# Patient Record
Sex: Male | Born: 1950 | Race: White | Hispanic: No | Marital: Single | Smoking: Never smoker
Health system: Southern US, Community
[De-identification: ages and names within clinical notes are randomized; demographics above are authoritative.]

## PROBLEM LIST (undated history)

## (undated) DIAGNOSIS — K589 Irritable bowel syndrome without diarrhea: Secondary | ICD-10-CM

## (undated) DIAGNOSIS — M51379 Other intervertebral disc degeneration, lumbosacral region without mention of lumbar back pain or lower extremity pain: Secondary | ICD-10-CM

## (undated) DIAGNOSIS — M76899 Other specified enthesopathies of unspecified lower limb, excluding foot: Secondary | ICD-10-CM

## (undated) DIAGNOSIS — M5137 Other intervertebral disc degeneration, lumbosacral region: Secondary | ICD-10-CM

## (undated) DIAGNOSIS — K5792 Diverticulitis of intestine, part unspecified, without perforation or abscess without bleeding: Secondary | ICD-10-CM

## (undated) DIAGNOSIS — Z9109 Other allergy status, other than to drugs and biological substances: Secondary | ICD-10-CM

## (undated) DIAGNOSIS — Z8619 Personal history of other infectious and parasitic diseases: Secondary | ICD-10-CM

## (undated) DIAGNOSIS — E7800 Pure hypercholesterolemia, unspecified: Secondary | ICD-10-CM

## (undated) DIAGNOSIS — K649 Unspecified hemorrhoids: Secondary | ICD-10-CM

## (undated) HISTORY — PX: HEMORRHOID SURGERY: SHX153

## (undated) HISTORY — PX: COLONOSCOPY: SHX174

## (undated) HISTORY — PX: ESOPHAGOGASTRODUODENOSCOPY: SHX1529

---

## 2005-06-09 ENCOUNTER — Emergency Department: Payer: Self-pay | Admitting: Emergency Medicine

## 2005-06-09 ENCOUNTER — Other Ambulatory Visit: Payer: Self-pay

## 2005-06-16 ENCOUNTER — Ambulatory Visit: Payer: Self-pay | Admitting: Internal Medicine

## 2005-07-13 ENCOUNTER — Ambulatory Visit: Payer: Self-pay | Admitting: Gastroenterology

## 2009-07-03 ENCOUNTER — Ambulatory Visit: Payer: Self-pay | Admitting: Surgery

## 2010-11-10 ENCOUNTER — Ambulatory Visit: Payer: Self-pay | Admitting: Unknown Physician Specialty

## 2011-09-17 ENCOUNTER — Ambulatory Visit: Payer: Self-pay | Admitting: Gastroenterology

## 2011-10-26 ENCOUNTER — Ambulatory Visit: Payer: Self-pay | Admitting: Unknown Physician Specialty

## 2012-02-08 DIAGNOSIS — R194 Change in bowel habit: Secondary | ICD-10-CM | POA: Insufficient documentation

## 2012-02-08 DIAGNOSIS — K589 Irritable bowel syndrome without diarrhea: Secondary | ICD-10-CM | POA: Insufficient documentation

## 2012-08-29 ENCOUNTER — Ambulatory Visit: Payer: Self-pay | Admitting: Unknown Physician Specialty

## 2013-11-13 DIAGNOSIS — M76899 Other specified enthesopathies of unspecified lower limb, excluding foot: Secondary | ICD-10-CM | POA: Insufficient documentation

## 2014-03-30 IMAGING — RF DG UGI W/ SMALL BOWEL
1 series · 15 of 15 positions shown · non-contrast
Comparison: none

REASON FOR EXAM: irritable colon
COMMENTS:

[Series 1: run · 15 of 15 slices shown]
[im 1/15]
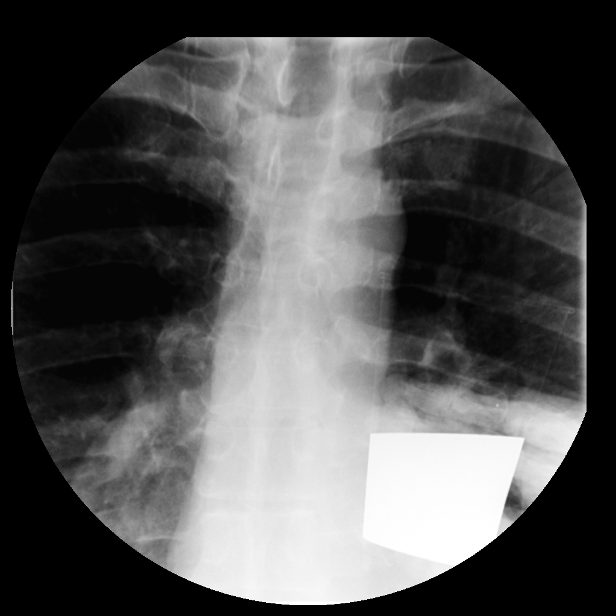
[im 2/15]
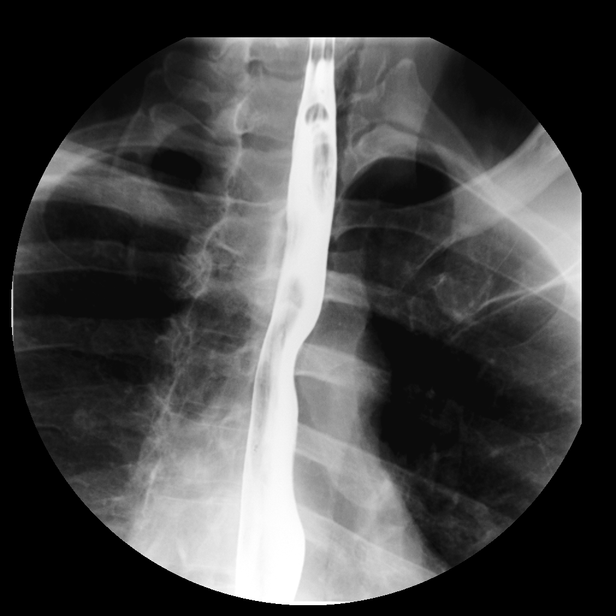
[im 3/15]
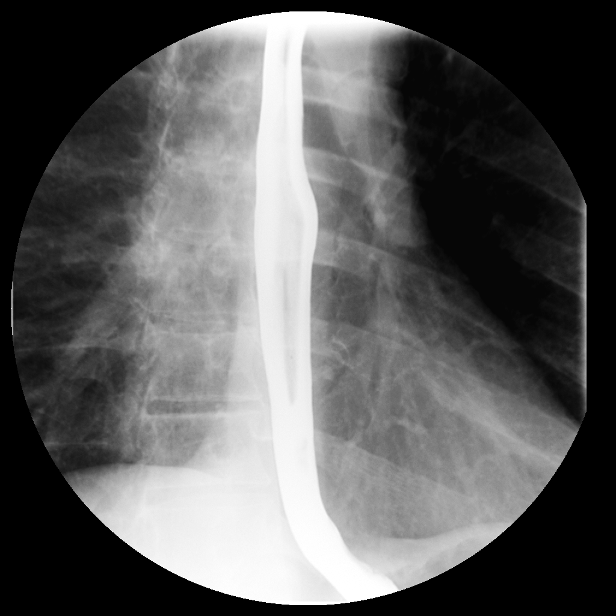
[im 4/15]
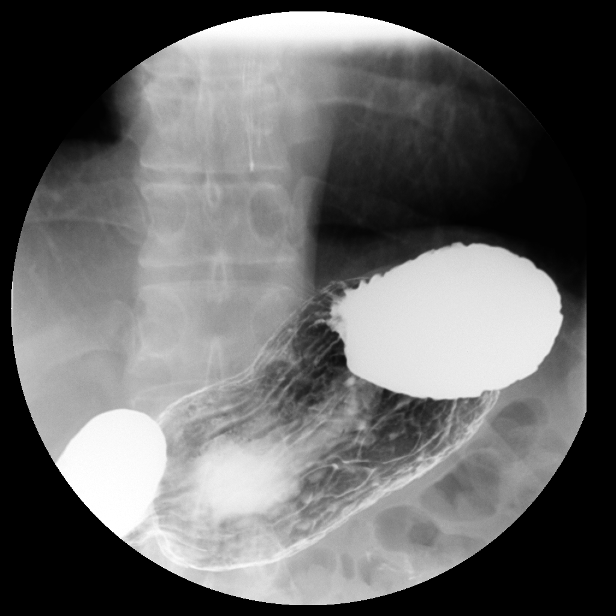
[im 5/15]
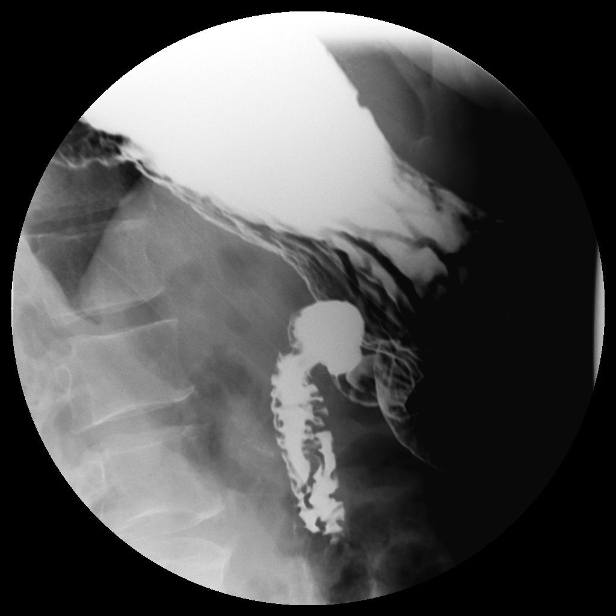
[im 6/15]
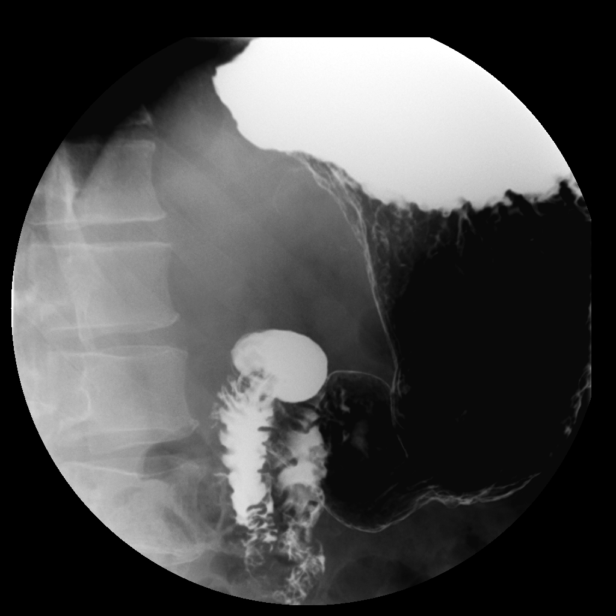
[im 7/15]
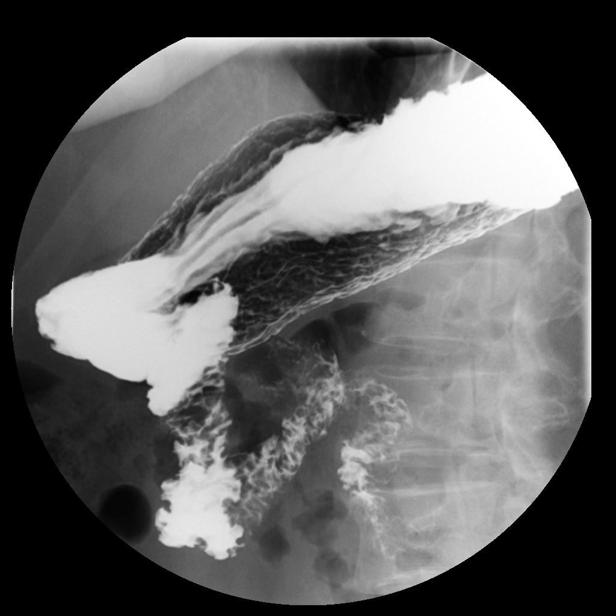
[im 8/15]
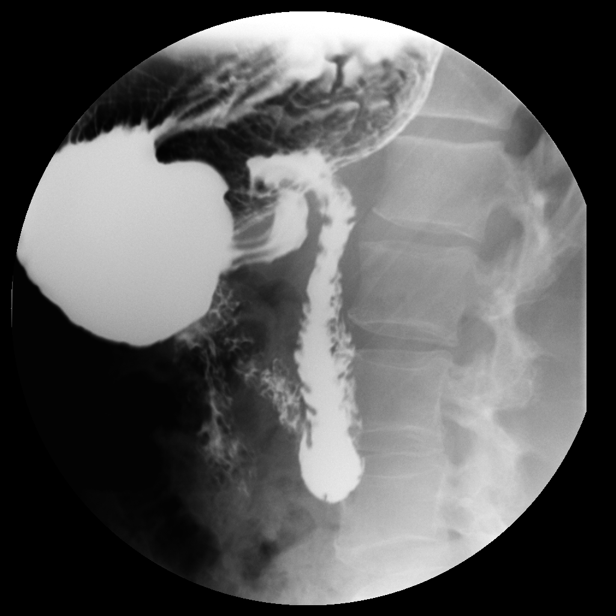
[im 9/15]
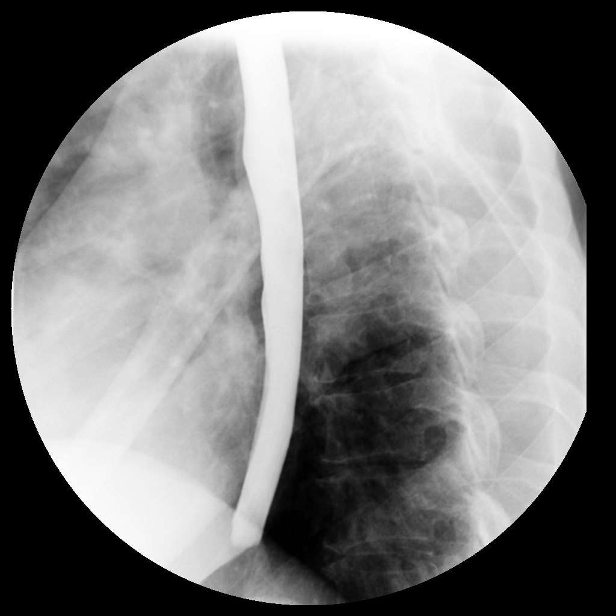
[im 10/15]
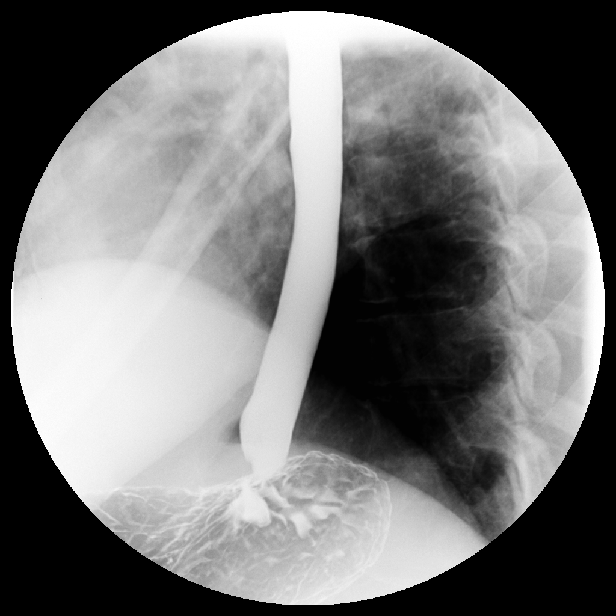
[im 11/15]
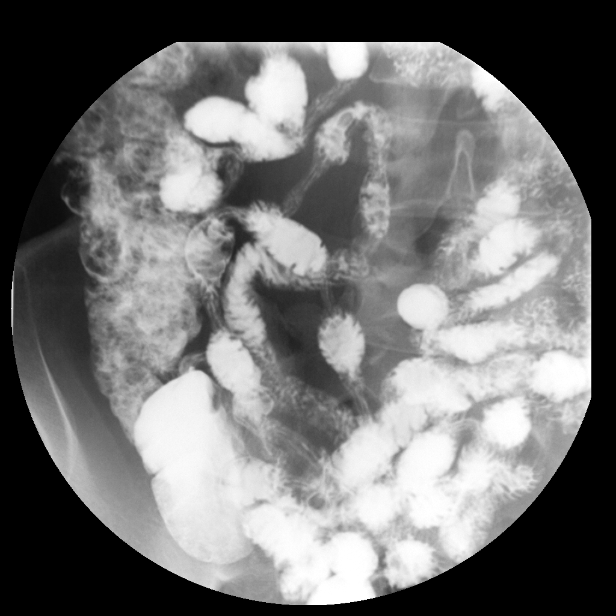
[im 12/15]
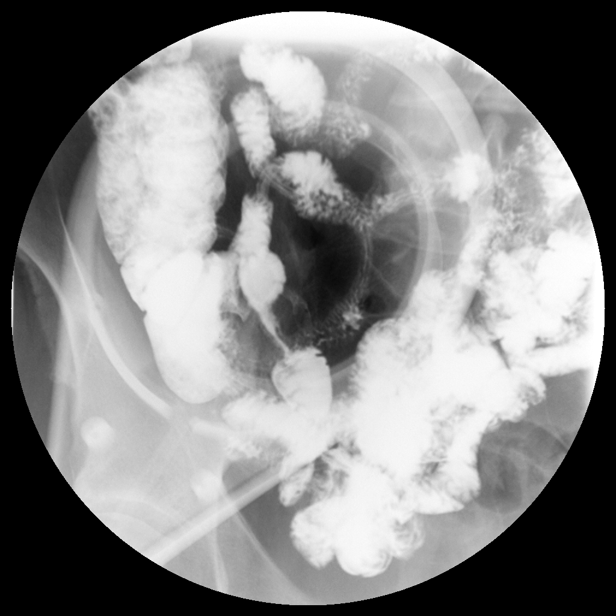
[im 13/15]
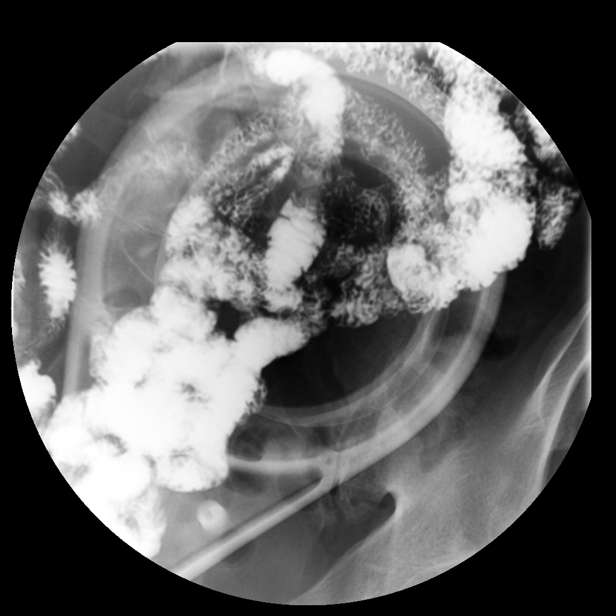
[im 14/15]
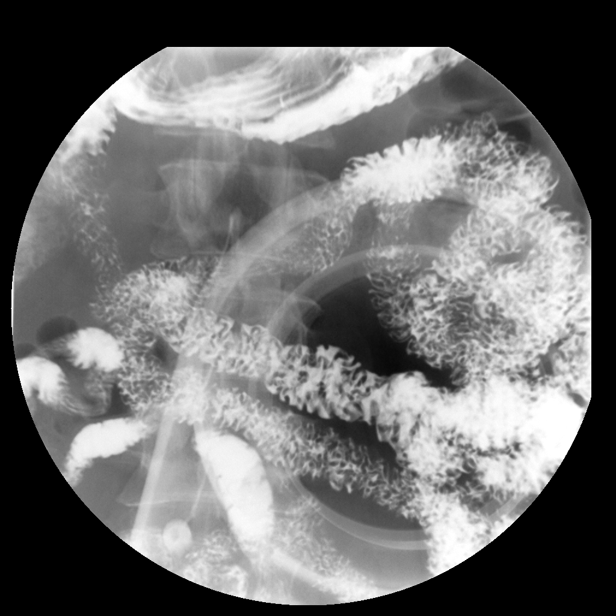
[im 15/15]
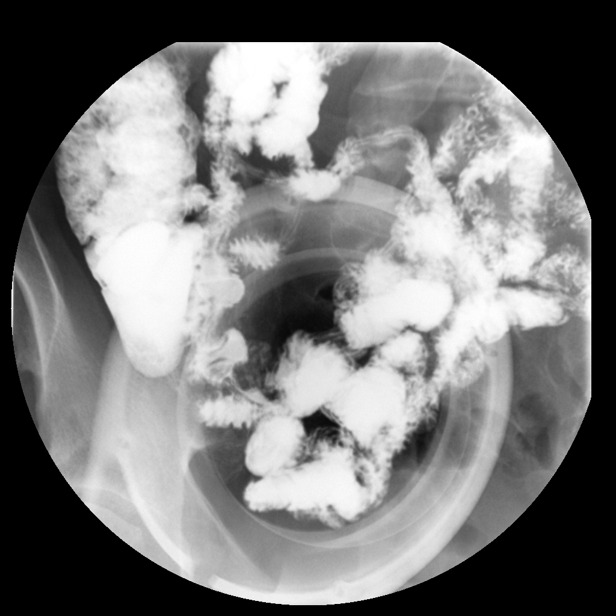

[15 of 15 positions shown; findings below may reference images not displayed]

PROCEDURE:     FL  - FL UPPER GI WITH SMALL BOWEL  - September 17, 2011  [DATE]

RESULT:     Standard air contrast upper GI and standard small bowel
follow-through was performed. The esophagus and stomach are normal. Duodenal
bulb is normal. Small bowel is normal. Terminal ileum is normal. Bowel
transit time is normal.
IMPRESSION: Normal exam. No evidence of reflux or peptic ulcer disease.
Small bowel is normal.

## 2014-06-12 ENCOUNTER — Ambulatory Visit: Payer: Self-pay | Admitting: Unknown Physician Specialty

## 2014-06-26 DIAGNOSIS — M51369 Other intervertebral disc degeneration, lumbar region without mention of lumbar back pain or lower extremity pain: Secondary | ICD-10-CM | POA: Insufficient documentation

## 2014-06-26 DIAGNOSIS — M5416 Radiculopathy, lumbar region: Secondary | ICD-10-CM | POA: Insufficient documentation

## 2015-02-06 ENCOUNTER — Ambulatory Visit
Admit: 2015-02-06 | Discharge: 2015-02-06 | Disposition: A | Discharge status: Home / Self Care | Payer: BC Managed Care – PPO | Attending: Family Medicine | Admitting: Family Medicine

## 2015-02-06 ENCOUNTER — Ambulatory Visit
Admission: EM | Admit: 2015-02-06 | Discharge: 2015-02-06 | Disposition: A | Discharge status: Home / Self Care | Payer: BC Managed Care – PPO | Attending: Family Medicine | Admitting: Family Medicine

## 2015-02-06 ENCOUNTER — Encounter: Payer: Self-pay | Admitting: Emergency Medicine

## 2015-02-06 DIAGNOSIS — IMO0001 Reserved for inherently not codable concepts without codable children: Secondary | ICD-10-CM

## 2015-02-06 DIAGNOSIS — R911 Solitary pulmonary nodule: Secondary | ICD-10-CM | POA: Diagnosis not present

## 2015-02-06 DIAGNOSIS — R1032 Left lower quadrant pain: Secondary | ICD-10-CM

## 2015-02-06 HISTORY — DX: Diverticulitis of intestine, part unspecified, without perforation or abscess without bleeding: K57.92

## 2015-02-06 LAB — COMPREHENSIVE METABOLIC PANEL
ALK PHOS: 35 U/L — AB (ref 38–126)
ALT: 16 U/L — ABNORMAL LOW (ref 17–63)
ANION GAP: 8 (ref 5–15)
AST: 20 U/L (ref 15–41)
Albumin: 4.8 g/dL (ref 3.5–5.0)
BUN: 7 mg/dL (ref 6–20)
CALCIUM: 9.3 mg/dL (ref 8.9–10.3)
CHLORIDE: 94 mmol/L — AB (ref 101–111)
CO2: 30 mmol/L (ref 22–32)
CREATININE: 0.86 mg/dL (ref 0.61–1.24)
Glucose, Bld: 94 mg/dL (ref 65–99)
Potassium: 4.4 mmol/L (ref 3.5–5.1)
SODIUM: 132 mmol/L — AB (ref 135–145)
Total Bilirubin: 2.2 mg/dL — ABNORMAL HIGH (ref 0.3–1.2)
Total Protein: 7.9 g/dL (ref 6.5–8.1)

## 2015-02-06 LAB — CBC WITH DIFFERENTIAL/PLATELET
BASOS ABS: 0 10*3/uL (ref 0–0.1)
Basophils Relative: 1 %
Eosinophils Absolute: 0 10*3/uL (ref 0–0.7)
Eosinophils Relative: 1 %
HEMATOCRIT: 45.9 % (ref 40.0–52.0)
HEMOGLOBIN: 15.6 g/dL (ref 13.0–18.0)
Lymphocytes Relative: 22 %
Lymphs Abs: 0.9 10*3/uL — ABNORMAL LOW (ref 1.0–3.6)
MCH: 33.5 pg (ref 26.0–34.0)
MCHC: 34 g/dL (ref 32.0–36.0)
MCV: 98.6 fL (ref 80.0–100.0)
Monocytes Absolute: 0.7 10*3/uL (ref 0.2–1.0)
Monocytes Relative: 15 %
NEUTROS ABS: 2.7 10*3/uL (ref 1.4–6.5)
NEUTROS PCT: 61 %
Platelets: 182 10*3/uL (ref 150–440)
RBC: 4.65 MIL/uL (ref 4.40–5.90)
RDW: 12.6 % (ref 11.5–14.5)
WBC: 4.4 10*3/uL (ref 3.8–10.6)

## 2015-02-06 LAB — URINALYSIS COMPLETE WITH MICROSCOPIC (ARMC ONLY)
BACTERIA UA: NONE SEEN — AB
BILIRUBIN URINE: NEGATIVE
GLUCOSE, UA: NEGATIVE mg/dL
HGB URINE DIPSTICK: NEGATIVE
Ketones, ur: NEGATIVE mg/dL
LEUKOCYTES UA: NEGATIVE
NITRITE: NEGATIVE
PH: 7 (ref 5.0–8.0)
Protein, ur: NEGATIVE mg/dL
RBC / HPF: NONE SEEN RBC/hpf (ref <3)
SPECIFIC GRAVITY, URINE: 1.01 (ref 1.005–1.030)
Squamous Epithelial / LPF: NONE SEEN — AB

## 2015-02-06 LAB — LIPASE, BLOOD: LIPASE: 21 U/L — AB (ref 22–51)

## 2015-02-06 LAB — AMYLASE: Amylase: 56 U/L (ref 28–100)

## 2015-02-06 MED ORDER — IOHEXOL 300 MG/ML  SOLN
150.0000 mL | Freq: Once | INTRAMUSCULAR | Status: AC | PRN
  Administered 2015-02-06: 120 mL via INTRAVENOUS

## 2015-02-06 MED ORDER — SUCRALFATE 1 G PO TABS: 2.0000 g | ORAL_TABLET | Freq: Two times a day (BID) | ORAL | Status: DC

## 2015-02-06 NOTE — ED Notes (Signed)
This nurse contacted [employer] and authorization obtained for CT Abd/Pelvis with contrast. Authorization # [id number] good for 02/06/15-03/07/15. Spoke with [employer]

## 2015-02-06 NOTE — Discharge Instructions (Signed)
Abdominal Pain Many things can cause belly (abdominal) pain. Most times, the belly pain is not dangerous. Many cases of belly pain can be watched and treated at home. HOME CARE   Do not take medicines that help you go poop (laxatives) unless told to by your doctor.  Only take medicine as told by your doctor.  Eat or drink as told by your doctor. Your doctor will tell you if you should be on a special diet. GET HELP IF:  You do not know what is causing your belly pain.  You have belly pain while you are sick to your stomach (nauseous) or have runny poop (diarrhea).  You have pain while you pee or poop.  Your belly pain wakes you up at night.  You have belly pain that gets worse or better when you eat.  You have belly pain that gets worse when you eat fatty foods.  You have a fever. GET HELP RIGHT AWAY IF:   The pain does not go away within 2 hours.  You keep throwing up (vomiting).  The pain changes and is only in the right or left part of the belly.  You have bloody or tarry looking poop. MAKE SURE YOU:   Understand these instructions.  Will watch your condition.  Will get help right away if you are not doing well or get worse. Document Released: 10/20/2007 Document Revised: 05/08/2013 Document Reviewed: 01/10/2013 [facility] Patient Information 2015 [place], [place]. This information is not intended to replace advice given to you by your health care provider. Make sure you discuss any questions you have with your health care provider.  Pulmonary Nodule  A pulmonary nodule is a small, round spot in your lung. It is usually found when pictures of your lungs are taken for other reasons. Most pulmonary nodules are not cancerous and do not cause symptoms. Tests will be done to make sure the nodule is not cancerous. Pulmonary nodules that are not cancerous usually do not require treatment. HOME CARE   Only take medicine as told by your doctor.  Follow up with your doctor as  told. GET HELP IF:  You have trouble breathing when doing activities.  You feel sick or more tired than normal.  You do not feel like eating.  You lose weight without trying to.  You have chills.  You have night sweats. GET HELP RIGHT AWAY IF:  You cannot catch your breath.  You start making whistling sounds when breathing (wheezing).  You have a cough that does not go away.  You cough up blood.  You are dizzy or feel like you are going to pass out.  You have sudden chest pain.  You have a fever or lasting symptoms for more than 2-3 days.  You have a fever and your symptoms suddenly get worse. MAKE SURE YOU:  Understand these instructions.  Will watch your condition.  Will get help right away if you are not doing well or get worse. Document Released: 06/05/2010 Document Revised: 01/03/2013 Document Reviewed: 10/23/2012 [facility] Patient Information 2015 [place], [place]. This information is not intended to replace advice given to you by your health care provider. Make sure you discuss any questions you have with your health care provider.

## 2015-02-06 NOTE — ED Notes (Signed)
Patient c/o left sided abdominal pain and bloating for the past 2 days.  Patient denies N/V/D.  Patient denies fevers.

## 2015-02-06 NOTE — ED Provider Notes (Signed)
CSN: [id number]     Arrival date & time 02/06/15  0721 History   First MD Initiated Contact with Patient 02/06/15 0802     Chief Complaint  Patient presents with  . Abdominal Pain   He reports abdominal pain for about 3 days. Eating food makes the pain worse He denies seeing blood in stool. Does report some changes in stool consistency and more constipation at this time. He's had this before years ago and he was put on anabiotic's for possible diverticulitis. At that time he had no x-ray studies or diagnostic studies to prove that he had diverticulitis. His last colonoscopy was 5 years ago he had 2 polyps removed and he was also told he had diverticulosis at that time. (Consider location/radiation/quality/duration/timing/severity/associated sxs/prior Treatment) Patient is a 64 y.o. male presenting with abdominal pain. The history is provided by the patient. No language interpreter was used.  Abdominal Pain Pain location:  LLQ Pain quality: aching, bloating, cramping, fullness and stabbing   Pain radiates to:  Does not radiate Pain severity:  Moderate Duration:  3 days Progression:  Waxing and waning Chronicity:  New Context: not alcohol use, not medication withdrawal, not previous surgeries, not recent travel, not retching, not sick contacts, not suspicious food intake and not trauma   Relieved by:  Nothing Worsened by:  Nothing tried Associated symptoms: constipation and nausea   Associated symptoms: no anorexia, no chest pain, no shortness of breath and no vomiting   Risk factors: aspirin and being elderly   Risk factors: no alcohol abuse, has not had multiple surgeries, no NSAID use and not obese     Past Medical History  Diagnosis Date  . Diverticulitis    History reviewed. No pertinent past surgical history. History reviewed. No pertinent family history. Social History  Substance Use Topics  . Smoking status: Never Smoker   . Smokeless tobacco: Never Used  . Alcohol Use: No     nurse's notes were reviewed. Patient does not smoke or drink alcohol. He has trouble with allergies to use Zyrtec on a daily basis especially when he mows his lawn. Review of Systems  HENT: Negative.   Respiratory: Negative for chest tightness and shortness of breath.   Cardiovascular: Negative for chest pain.  Gastrointestinal: Positive for nausea, abdominal pain, constipation and abdominal distention. Negative for vomiting and anorexia.  All other systems reviewed and are negative.   Allergies  Review of patient's allergies indicates no known allergies.  Home Medications   Prior to Admission medications   Medication Sig Start Date End Date Taking? Authorizing Provider  aspirin 81 MG tablet Take 81 mg by mouth daily.   Yes Historical Provider, MD  cetirizine (ZYRTEC) 10 MG tablet Take 10 mg by mouth daily.   Yes Historical Provider, MD  sucralfate (CARAFATE) 1 G tablet Take 2 tablets (2 g total) by mouth 2 (two) times daily. An hour before eating. 02/06/15   Hassan Rowan, MD   Meds Ordered and Administered this Visit  Medications - No data to display  BP 114/65 mmHg  Pulse 62  Temp(Src) 96.9 F (36.1 C) (Tympanic)  Resp 16  Ht  (1.88 m)  Wt 185 lb (83.915 kg)  BMI 23.74 kg/m2  SpO2 98% No data found.   Physical Exam  Constitutional: He is oriented to person, place, and time. He appears well-developed and well-nourished.  HENT:  Head: Normocephalic and atraumatic.  Eyes: Conjunctivae are normal. Pupils are equal, round, and reactive to light.  Neck: Normal range of motion. Neck supple. No thyromegaly present.  Cardiovascular: Normal rate, regular rhythm and normal heart sounds.   No murmur heard. Pulmonary/Chest: Effort normal and breath sounds normal.  Abdominal: Soft. He exhibits distension. He exhibits no mass. There is no hepatosplenomegaly. There is tenderness in the left lower quadrant. There is no rebound, no guarding and no CVA tenderness. A hernia is  present. Hernia confirmed positive in the ventral area.  Musculoskeletal: Normal range of motion.  Neurological: He is alert and oriented to person, place, and time. He has normal reflexes.  Skin: Skin is warm.  Psychiatric: He has a normal mood and affect.    ED Course  Procedures (including critical care time)  Labs Review Labs Reviewed  CBC WITH DIFFERENTIAL/PLATELET - Abnormal; Notable for the following:    Lymphs Abs 0.9 (*)    All other components within normal limits  LIPASE, BLOOD - Abnormal; Notable for the following:    Lipase 21 (*)    All other components within normal limits  COMPREHENSIVE METABOLIC PANEL - Abnormal; Notable for the following:    Sodium 132 (*)    Chloride 94 (*)    ALT 16 (*)    Alkaline Phosphatase 35 (*)    Total Bilirubin 2.2 (*)    All other components within normal limits  URINALYSIS COMPLETEWITH MICROSCOPIC ([facility] ONLY) - Abnormal; Notable for the following:    Color, Urine STRAW (*)    Bacteria, UA NONE SEEN (*)    Squamous Epithelial / LPF NONE SEEN (*)    All other components within normal limits  AMYLASE    Imaging Review Ct Abdomen Pelvis W Contrast  02/06/2015   CLINICAL DATA:  Acute left lower quadrant abdominal pain.  EXAM: CT ABDOMEN AND PELVIS WITH CONTRAST  TECHNIQUE: Multidetector CT imaging of the abdomen and pelvis was performed using the standard protocol following bolus administration of intravenous contrast.  CONTRAST:  OMNIPAQUE IOHEXOL 300 MG/ML  SOLN  COMPARISON:  None available currently.  FINDINGS: 4.4 mm subpleural nodule is noted anteriorly in the inferior portion of the lingular segment of the left lower lobe. No significant osseous abnormality is noted.  No gallstones are noted. The liver, spleen and pancreas appear normal. Adrenal glands appear normal. No hydronephrosis or renal obstruction is noted. No renal or ureteral calculi are noted. Parapelvic cysts are noted in the left kidney. There is no evidence of  bowel obstruction. The appendix appears normal. Urinary bladder appears normal. Mild prostatic enlargement is noted. There is no evidence of abdominal aortic aneurysm or dissection. No abnormal fluid collection is noted. No significant adenopathy is noted.  IMPRESSION: Mild prostatic enlargement.  No acute abnormality seen in the abdomen or pelvis.  4.4 mm subpleural nodule seen in left lung base. If the patient is at high risk for bronchogenic carcinoma, follow-up chest CT at 1 year is recommended. If the patient is at low risk, no follow-up is needed. This recommendation follows the consensus statement: Guidelines for Management of Small Pulmonary Nodules Detected on CT Scans: A Statement from the Fleischner Society as published in Radiology 2005; 237:395-400.   Electronically Signed   By: Lupita Raider, M.D.   On: 02/06/2015 10:32     Visual Acuity Review  Right Eye Distance:   Left Eye Distance:   Bilateral Distance:    Right Eye Near:   Left Eye Near:    Bilateral Near:    Results for orders placed or performed during  the hospital encounter of 02/06/15  CBC with Differential  Result Value Ref Range   WBC 4.4 3.8 - 10.6 K/uL   RBC 4.65 4.40 - 5.90 MIL/uL   Hemoglobin 15.6 13.0 - 18.0 g/dL   HCT 16.1 09.6 - 04.5 %   MCV 98.6 80.0 - 100.0 fL   MCH 33.5 26.0 - 34.0 pg   MCHC 34.0 32.0 - 36.0 g/dL   RDW 40.9 81.1 - 91.4 %   Platelets 182 150 - 440 K/uL   Neutrophils Relative % 61 %   Neutro Abs 2.7 1.4 - 6.5 K/uL   Lymphocytes Relative 22 %   Lymphs Abs 0.9 (L) 1.0 - 3.6 K/uL   Monocytes Relative 15 %   Monocytes Absolute 0.7 0.2 - 1.0 K/uL   Eosinophils Relative 1 %   Eosinophils Absolute 0.0 0 - 0.7 K/uL   Basophils Relative 1 %   Basophils Absolute 0.0 0 - 0.1 K/uL  Amylase  Result Value Ref Range   Amylase 56 28 - 100 U/L  Lipase, blood  Result Value Ref Range   Lipase 21 (L) 22 - 51 U/L  Comprehensive metabolic panel  Result Value Ref Range   Sodium 132 (L) 135 - 145  mmol/L   Potassium 4.4 3.5 - 5.1 mmol/L   Chloride 94 (L) 101 - 111 mmol/L   CO2 30 22 - 32 mmol/L   Glucose, Bld 94 65 - 99 mg/dL   BUN 7 6 - 20 mg/dL   Creatinine, Ser 7.82 0.61 - 1.24 mg/dL   Calcium 9.3 8.9 - 95.6 mg/dL   Total Protein 7.9 6.5 - 8.1 g/dL   Albumin 4.8 3.5 - 5.0 g/dL   AST 20 15 - 41 U/L   ALT 16 (L) 17 - 63 U/L   Alkaline Phosphatase 35 (L) 38 - 126 U/L   Total Bilirubin 2.2 (H) 0.3 - 1.2 mg/dL   GFR calc non Af Amer >60 >60 mL/min   GFR calc Af Amer >60 >60 mL/min   Anion gap 8 5 - 15  Urinalysis complete, with microscopic  Result Value Ref Range   Color, Urine STRAW (A) YELLOW   APPearance CLEAR CLEAR   Glucose, UA NEGATIVE NEGATIVE mg/dL   Bilirubin Urine NEGATIVE NEGATIVE   Ketones, ur NEGATIVE NEGATIVE mg/dL   Specific Gravity, Urine 1.010 1.005 - 1.030   Hgb urine dipstick NEGATIVE NEGATIVE   pH 7.0 5.0 - 8.0   Protein, ur NEGATIVE NEGATIVE mg/dL   Nitrite NEGATIVE NEGATIVE   Leukocytes, UA NEGATIVE NEGATIVE   RBC / HPF NONE SEEN <3 RBC/hpf   WBC, UA 0-5 <3 WBC/hpf   Bacteria, UA NONE SEEN (A) RARE   Squamous Epithelial / LPF NONE SEEN (A) RARE       MDM   1. Abdominal pain, LLQ (left lower quadrant)   2. Pulmonary nodule less than 6 cm determined by computed tomography of lung    Patient will be placed on Carafate for stomach discomfort. At this time no signs not colitis seen on CT scan. We'll have him follow-up pulmonary nodule with Dr. Arlana Pouch in the near future. Work note until Saturday given as well.    Hassan Rowan, MD 02/06/15 1100

## 2015-08-11 ENCOUNTER — Other Ambulatory Visit: Payer: Self-pay | Admitting: Surgery

## 2015-08-11 DIAGNOSIS — K824 Cholesterolosis of gallbladder: Secondary | ICD-10-CM

## 2015-08-19 ENCOUNTER — Ambulatory Visit
Admission: RE | Admit: 2015-08-19 | Discharge: 2015-08-19 | Disposition: A | Discharge status: Home / Self Care | Payer: BC Managed Care – PPO | Source: Ambulatory Visit | Attending: Surgery | Admitting: Surgery

## 2015-08-19 DIAGNOSIS — K824 Cholesterolosis of gallbladder: Secondary | ICD-10-CM | POA: Insufficient documentation

## 2016-05-21 ENCOUNTER — Ambulatory Visit
Admission: EM | Admit: 2016-05-21 | Discharge: 2016-05-21 | Disposition: A | Discharge status: Home / Self Care | Payer: BC Managed Care – PPO | Attending: Emergency Medicine | Admitting: Emergency Medicine

## 2016-05-21 ENCOUNTER — Encounter: Payer: Self-pay | Admitting: Emergency Medicine

## 2016-05-21 DIAGNOSIS — N410 Acute prostatitis: Secondary | ICD-10-CM | POA: Diagnosis not present

## 2016-05-21 LAB — URINALYSIS, COMPLETE (UACMP) WITH MICROSCOPIC
Bacteria, UA: NONE SEEN
Bilirubin Urine: NEGATIVE
Glucose, UA: NEGATIVE mg/dL
HGB URINE DIPSTICK: NEGATIVE
KETONES UR: NEGATIVE mg/dL
LEUKOCYTES UA: NEGATIVE
Nitrite: NEGATIVE
PH: 7 (ref 5.0–8.0)
Protein, ur: NEGATIVE mg/dL
RBC / HPF: NONE SEEN RBC/hpf (ref 0–5)
SPECIFIC GRAVITY, URINE: 1.015 (ref 1.005–1.030)
Squamous Epithelial / LPF: NONE SEEN

## 2016-05-21 MED ORDER — SULFAMETHOXAZOLE-TRIMETHOPRIM 800-160 MG PO TABS: 1.0000 | ORAL_TABLET | Freq: Two times a day (BID) | ORAL | 0 refills | Status: AC

## 2016-05-21 NOTE — ED Provider Notes (Signed)
[facility] ____________________________________________  Time seen: Approximately 8:45 AM  I have reviewed the triage vital signs and the nursing notes.   HISTORY  Chief Complaint Groin Pain   HPI Juan Lucero is a 66 y.o. male presenting for evaluation of testicular and groin pain since Wednesday. Patient reports that today pain has improved  compared to the previous 2 days. Patient states that currently he is not in much discomfort and states that he questioned being evaluated today. Patient reports for the last 3 days he noticed some bilateral testicular aching tenderness and some pressure discomfort behind his testicles. Patient reports that he has known enlarged prostate and has a generalized decrease in urine stream, but denies any acute changes in urinary stream. Denies any burning with urination or frequency. Reports some chills, but denies known fever. Denies any penile drainage, penile or testicular rash swelling or lesions. Denies any concerns of STDs. Patient reports that he has some chronic back pain but denies any worsening of back pain or any atypical pain. Denies abdominal discomfort, nausea, vomiting or diarrhea or constipation. Denies history of same in the past. Denies any fall or trauma.  Patient reports otherwise feels well and denies any other complaints.  Juan Shaggy, MD: PCP   Past Medical History:  Diagnosis Date  . Diverticulitis     There are no active problems to display for this patient.   History reviewed. No pertinent surgical history.  Current Outpatient Rx  . Order #: [id number] Class: Historical Med  . Order #: [id number] Class: Normal    No current facility-administered medications for this encounter.   Current Outpatient Prescriptions:  .  cetirizine (ZYRTEC) 10 MG tablet, Take 10 mg by mouth daily., Disp: , Rfl:  .  sulfamethoxazole-trimethoprim (BACTRIM DS,SEPTRA DS) 800-160 MG tablet, Take 1 tablet by mouth 2 (two)  times daily., Disp: 28 tablet, Rfl: 0  Allergies Patient has no known allergies.  History reviewed. No pertinent family history.  Social History Social History  Substance Use Topics  . Smoking status: Never Smoker  . Smokeless tobacco: Never Used  . Alcohol use No    Review of Systems Constitutional: No fever/chills Eyes: No visual changes. ENT: No sore throat. Cardiovascular: Denies chest pain. Respiratory: Denies shortness of breath. Gastrointestinal: No abdominal pain.  No nausea, no vomiting.  No diarrhea.  No constipation. Genitourinary: Negative for dysuria. Musculoskeletal: Positive for back pain. Skin: Negative for rash. Neurological: Negative for headaches, focal weakness or numbness.  10-point ROS otherwise negative.  ____________________________________________   PHYSICAL EXAM:  VITAL SIGNS: ED Triage Vitals  Enc Vitals Group     BP 05/21/16 0836 123/70     Pulse Rate 05/21/16 0836 75     Resp 05/21/16 0836 16     Temp 05/21/16 0836 98 F (36.7 C)     Temp Source 05/21/16 0836 Oral     SpO2 05/21/16 0836 99 %     Weight 05/21/16 0834 185 lb (83.9 kg)     Height 05/21/16 0834 6\' 2"  (1.88 m)     Head Circumference --      Peak Flow --      Pain Score 05/21/16 0836 3     Pain Loc --      Pain Edu? --      Excl. in GC? --     Constitutional: Alert and oriented. Well appearing and in no acute distress. Eyes: Conjunctivae are normal. PERRL. EOMI. ENT      Head: Normocephalic and  atraumatic.      Nose: No congestion/rhinnorhea.      Mouth/Throat: Mucous membranes are moist.Oropharynx non-erythematous. Neck: No stridor. Supple without meningismus.  Hematological/Lymphatic/Immunilogical: No cervical lymphadenopathy. Cardiovascular: Normal rate, regular rhythm. Grossly normal heart sounds.  Good peripheral circulation. Respiratory: Normal respiratory effort without tachypnea nor retractions. Breath sounds are clear and equal bilaterally. No  wheezes/rales/rhonchi.. Gastrointestinal: Soft and nontender. No distention. Normal Bowel sounds. No CVA tenderness. Male and Prostate exam: completed with Juan Lucero at bedside a chaperone.   uncircumcised. No penile or testicular rash, lesion, erythema or swelling noted. No mass, bulge or inguinal tenderness bilaterally. No penile discharge. No testicular tenderness to palpation. Prostate exam completed. Firm mildly enlarged prostate noted with mild tenderness.  Musculoskeletal:  ambulatory with steady gait.No midline cervical, thoracic or lumbar tenderness to palpation.  Neurologic:  Normal speech and language. Speech is normal. No gait instability.  Skin:  Skin is warm, dry and intact. No rash noted. Psychiatric: Mood and affect are normal. Speech and behavior are normal. Patient exhibits appropriate insight and judgment   ___________________________________________   LABS (all labs ordered are listed, but only abnormal results are displayed)  Labs Reviewed  URINALYSIS, COMPLETE (UACMP) WITH MICROSCOPIC - Abnormal; Notable for the following:       Result Value   Color, Urine STRAW (*)    All other components within normal limits  URINE CULTURE   ____________________________________________ PROCEDURES Procedures    INITIAL IMPRESSION / ASSESSMENT AND PLAN / ED COURSE  Pertinent labs & imaging results that were available during my care of the patient were reviewed by me and considered in my medical decision making (see chart for details).  Very well-appearing patient. No acute distress. Presenting for description of testicular and perineal discomfort for the last 3 days. Urinalysis reviewed. No testicular tenderness on exam. Mild prostate tenderness, with known history of prostate enlargement. Suspect prostatitis. Discussed with patient, will treat patient with oral Bactrim. Discussed need for follow-up with PCP. Discussed strict follow-up and return parameters including any increase  of pain, fevers, testicular tenderness to palpation or worsening concerns. Discussed indication, risks and benefits of medications with patient.  Discussed follow up with Primary care physician this week. Discussed follow up and return parameters including no resolution or any worsening concerns. Patient verbalized understanding and agreed to plan.   ____________________________________________   FINAL CLINICAL IMPRESSION(S) / ED DIAGNOSES  Final diagnoses:  Acute prostatitis     Discharge Medication List as of 05/21/2016  9:27 AM    START taking these medications   Details  sulfamethoxazole-trimethoprim (BACTRIM DS,SEPTRA DS) 800-160 MG tablet Take 1 tablet by mouth 2 (two) times daily., Starting Fri 05/21/2016, Until Fri 06/04/2016, Normal        Note: This dictation was prepared with Dragon dictation along with smaller phrase technology. Any transcriptional errors that result from this process are unintentional.    Clinical Course       Renford DillsLindsey Donnivan Villena, NP 05/21/16 1222

## 2016-05-21 NOTE — Discharge Instructions (Signed)
Take medication as prescribed. Rest. Drink plenty of fluids.  ° °Follow up with your primary care physician as discussed. Return to Urgent care for new or worsening concerns.  ° °

## 2016-05-21 NOTE — ED Triage Notes (Signed)
Patient c/o pain in his testicles and groin pain that started Wed evening.

## 2016-05-22 LAB — URINE CULTURE: Culture: NO GROWTH

## 2016-08-31 IMAGING — US US ABDOMEN LIMITED
1 series · 14 of 25 positions shown · non-contrast
Comparison: CT abdomen pelvis of 02/06/2015 and ultrasound abdomen
of 10/26/2011

CLINICAL DATA: Gallbladder polyp, followup

EXAM:
US ABDOMEN LIMITED - RIGHT UPPER QUADRANT

[Series 1: us abdomen limited · 0.19mm/px · 14 of 65 slices shown]
[im 1/65]
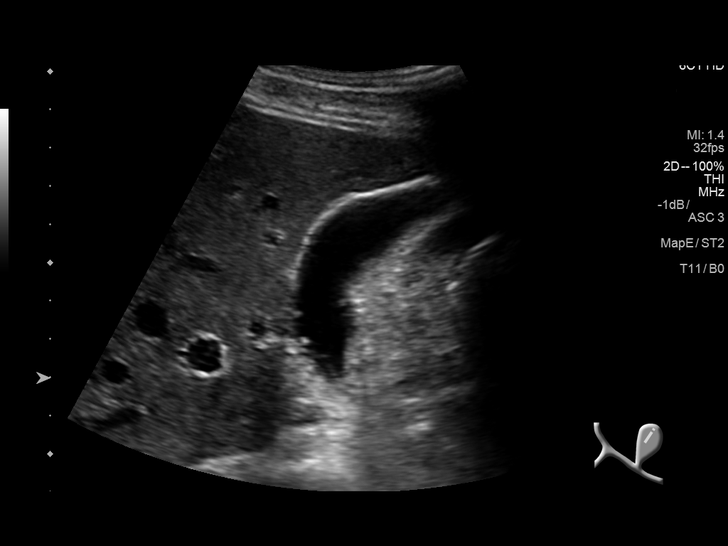
[im 6/65]
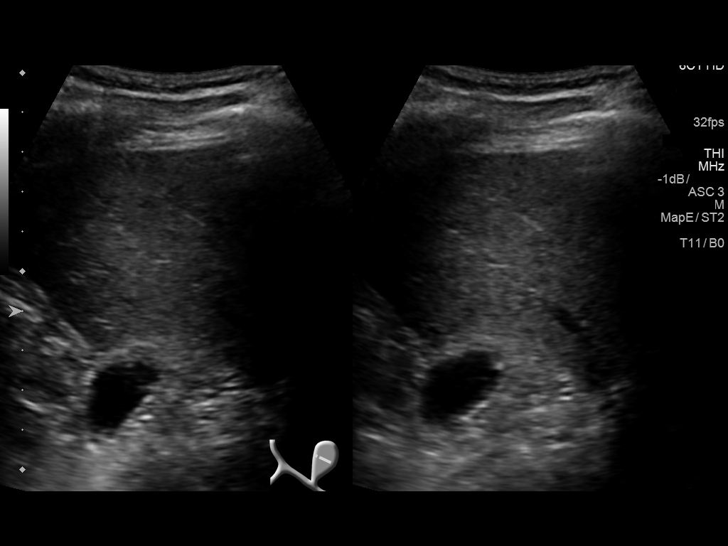
[im 11/65]
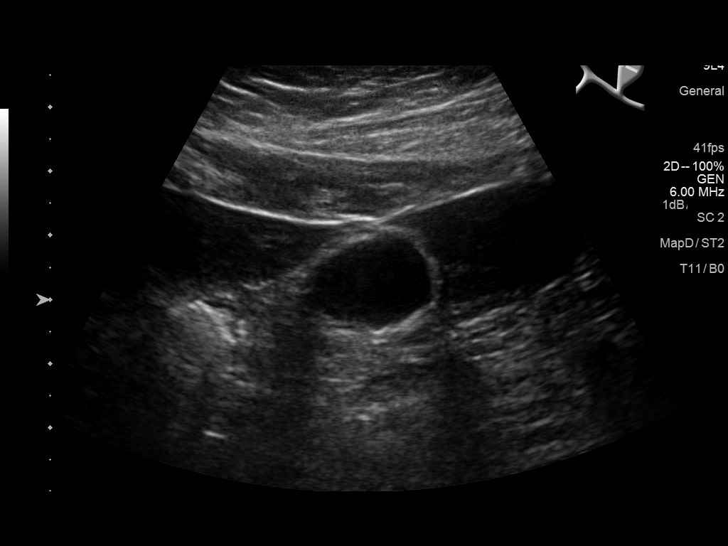
[im 17/65]
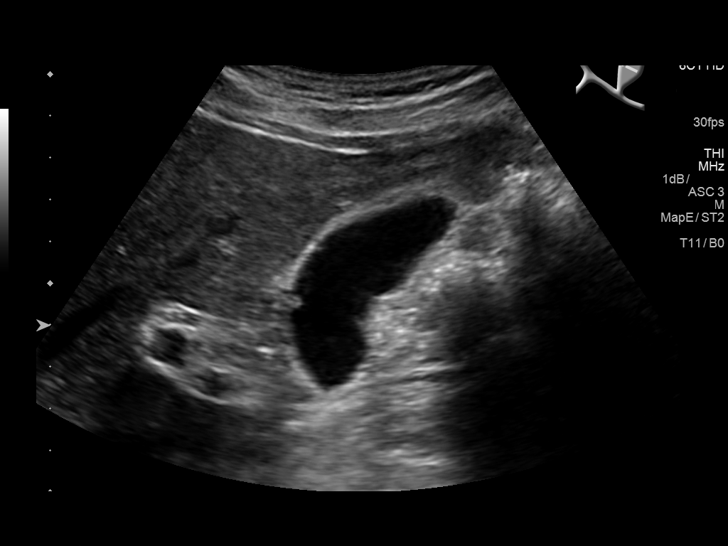
[im 22/65]
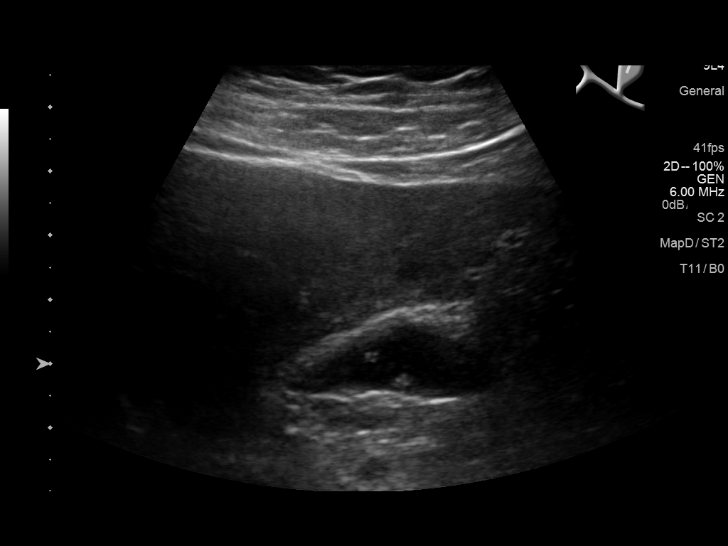
[im 25/65]
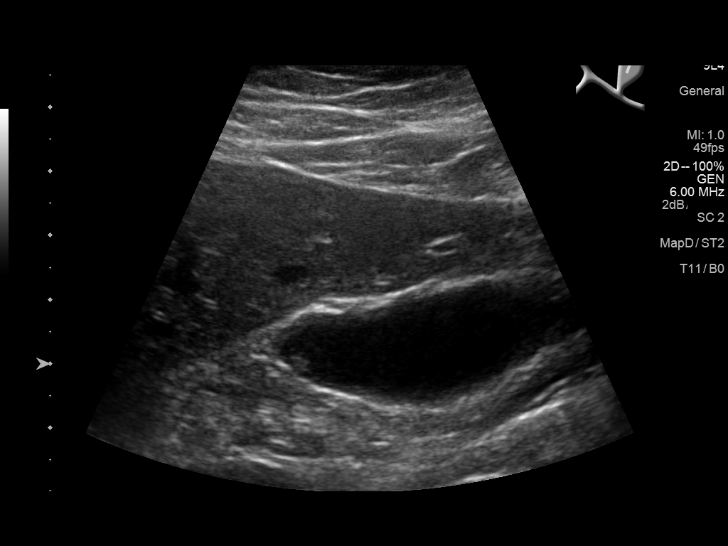
[im 30/65]
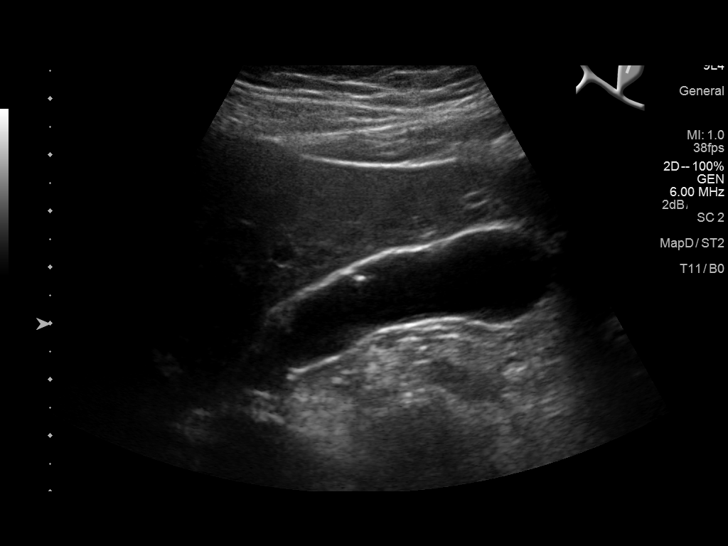
[im 35/65]
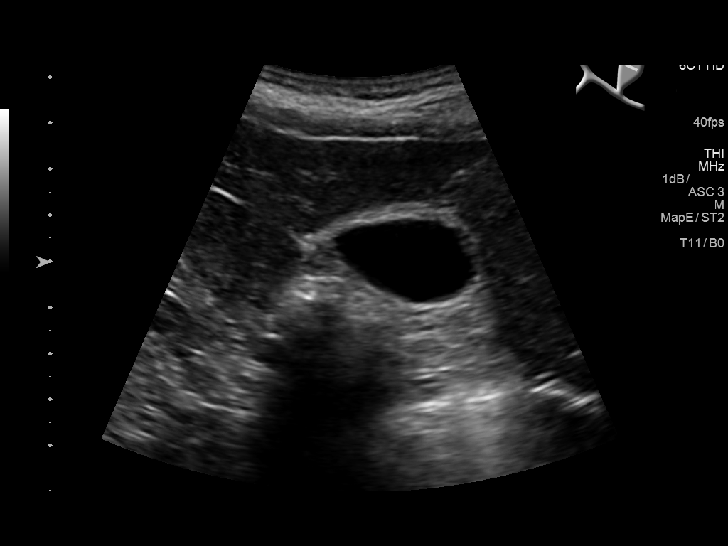
[im 41/65]
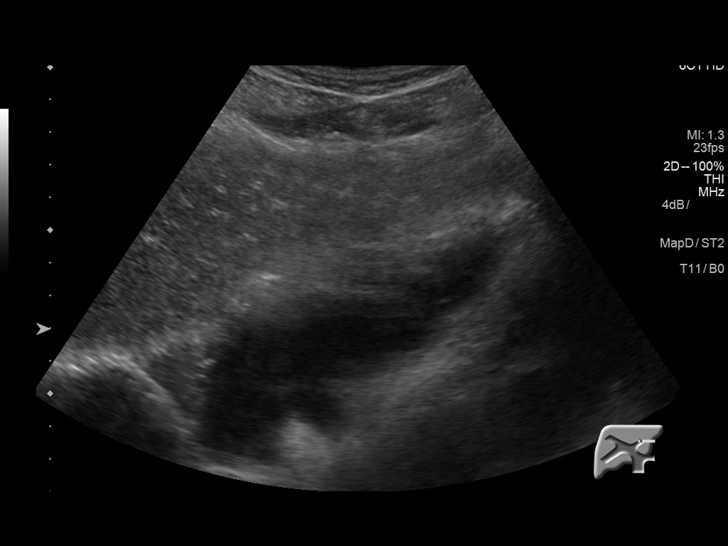
[im 43/65]
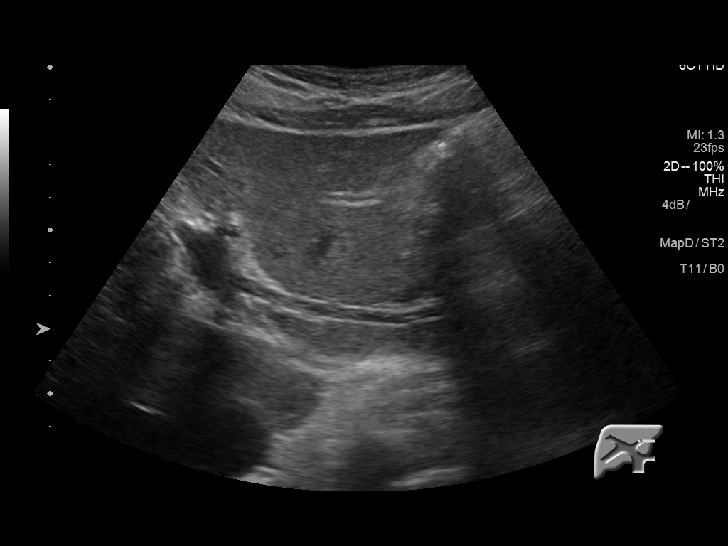
[im 49/65]
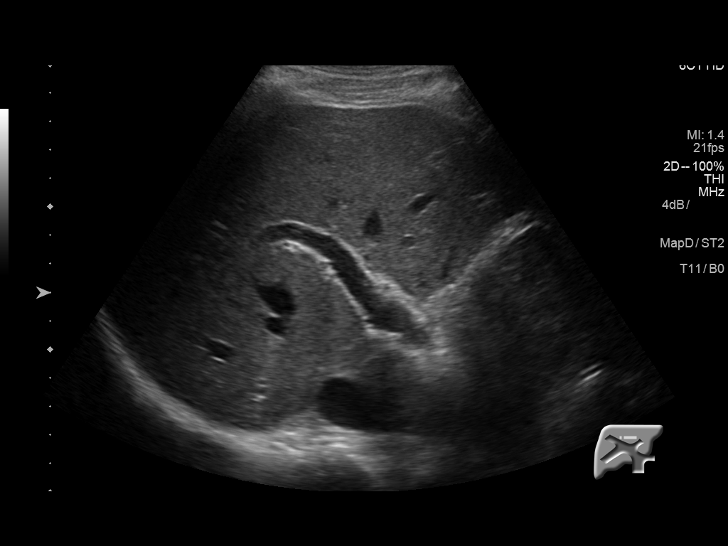
[im 54/65]
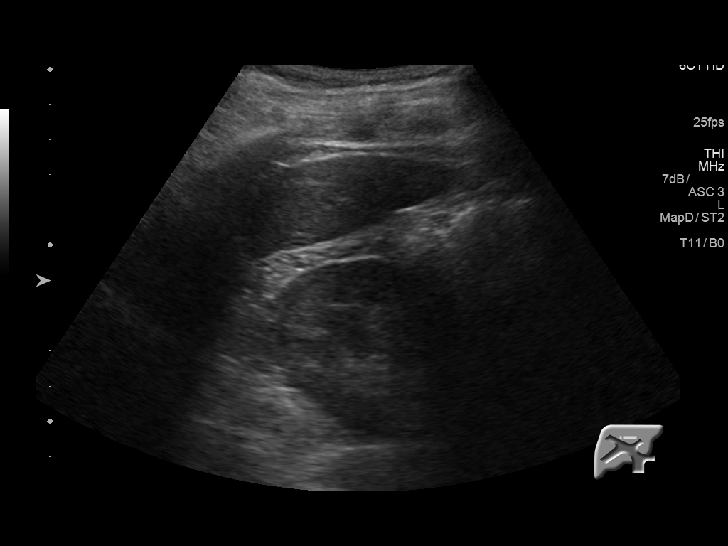
[im 59/65]
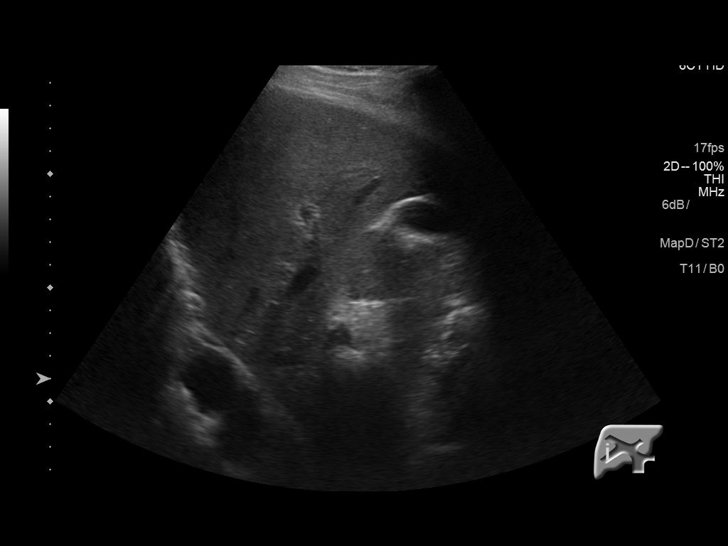
[im 65/65]
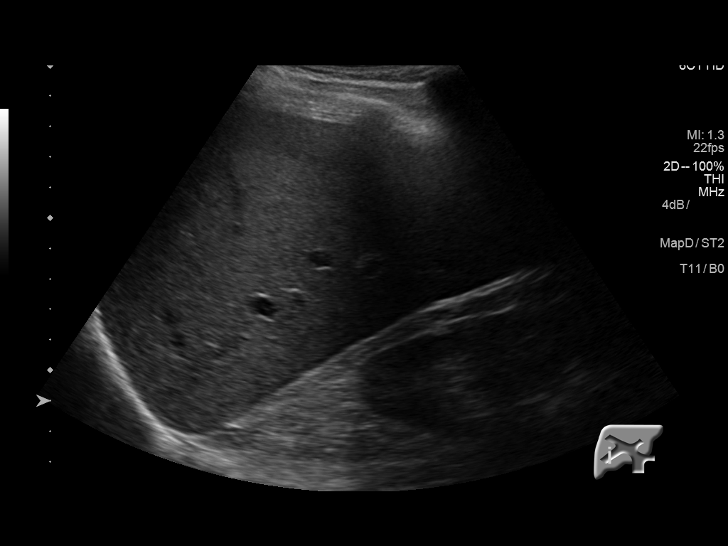

[14 of 25 positions shown; findings below may reference images not displayed]

FINDINGS: Gallbladder:

The gallbladder is visualized. There are several echogenic foci
within the gallbladder without acoustical shadowing which do remain
non mobile. These most represent most likely represent gallbladder
polyps, the largest of 5 mm in diameter compared to 3 mm on the
prior ultrasound from 5106. No definite gallstones are seen.

Common bile duct:

Diameter:   The common bile duct is normal measuring 1.6 mm.

Liver:

The liver has a normal echogenic pattern. No focal hepatic
abnormality is seen.
IMPRESSION: 1. At least 4 gallbladder polyps the largest of 5 mm in diameter. No
definite gallstones are seen.
2. No abnormality of the liver is seen.

## 2017-08-19 IMAGING — CT CT ABD-PELV W/ CM
2 of 5 series · 17 of 46 positions shown, 19 images · IV contrast (omnipaque)
Comparison: None available currently.

CLINICAL DATA: Acute left lower quadrant abdominal pain.

EXAM:
CT ABDOMEN AND PELVIS WITH CONTRAST
TECHNIQUE: Multidetector CT imaging of the abdomen and pelvis was performed
using the standard protocol following bolus administration of
intravenous contrast.
CONTRAST:  120mL OMNIPAQUE IOHEXOL 300 MG/ML  SOLN

[Series 2: axial soft tissue · axial · 0.74mm/px · z∈[-999,-589]mm · 14 of 94 slices shown, 16 images]
[im 6/94  soft-tissue]
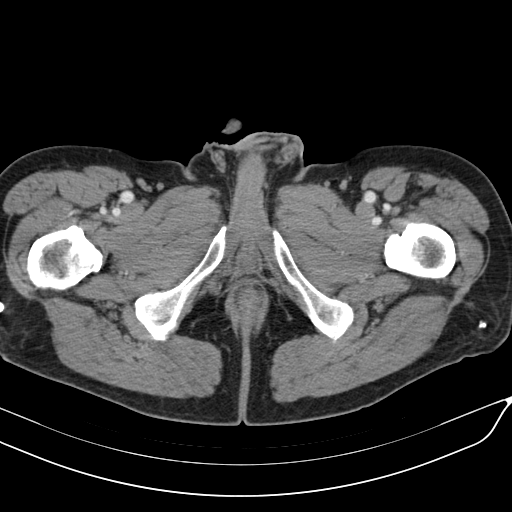
[im 6/94  bone]
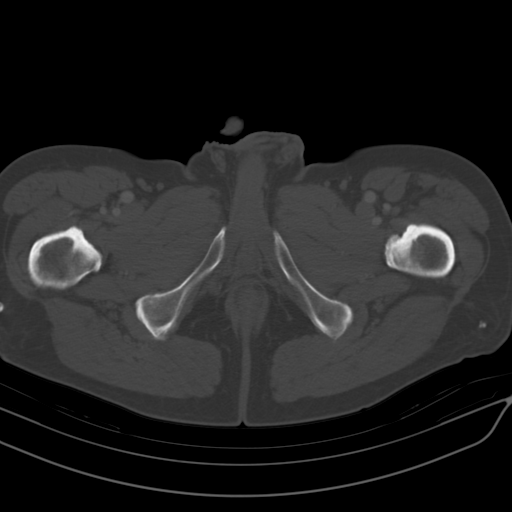
[im 11/94  soft-tissue]
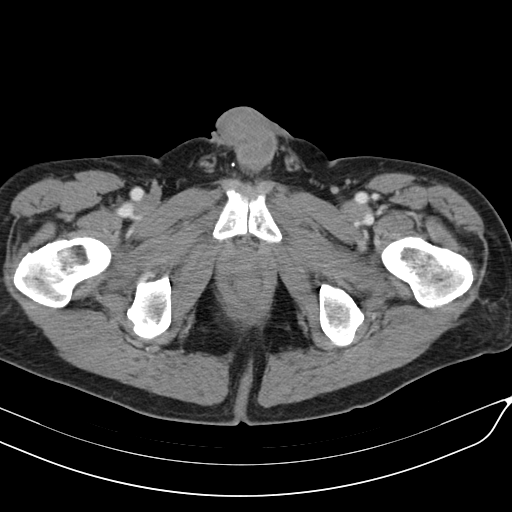
[im 21/94  soft-tissue]
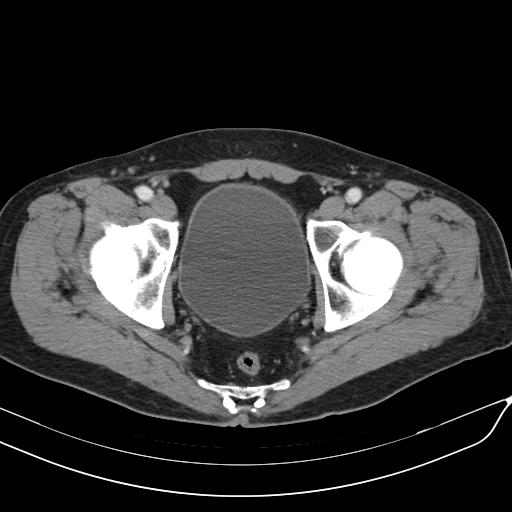
[im 26/94  soft-tissue]
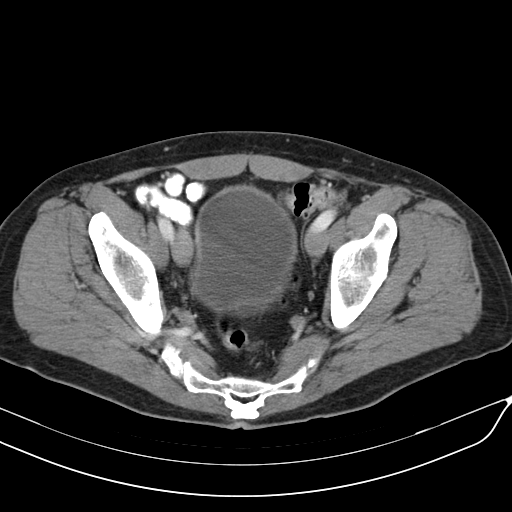
[im 32/94  soft-tissue]
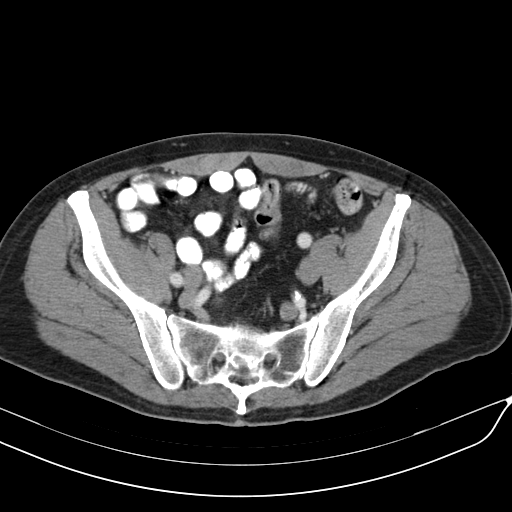
[im 37/94  soft-tissue]
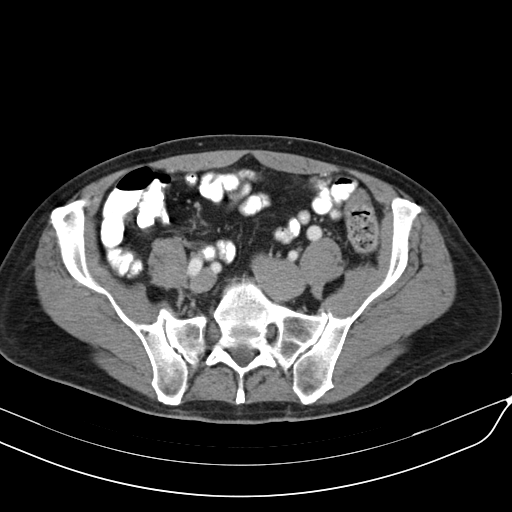
[im 42/94  soft-tissue]
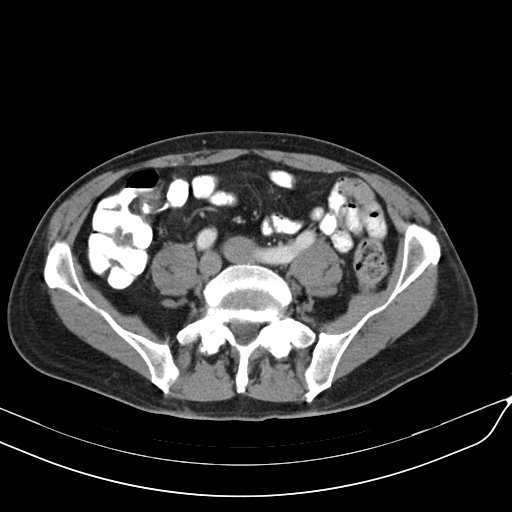
[im 52/94  soft-tissue]
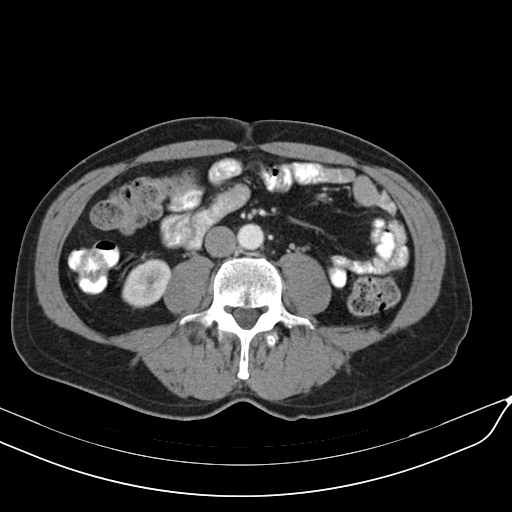
[im 57/94  soft-tissue]
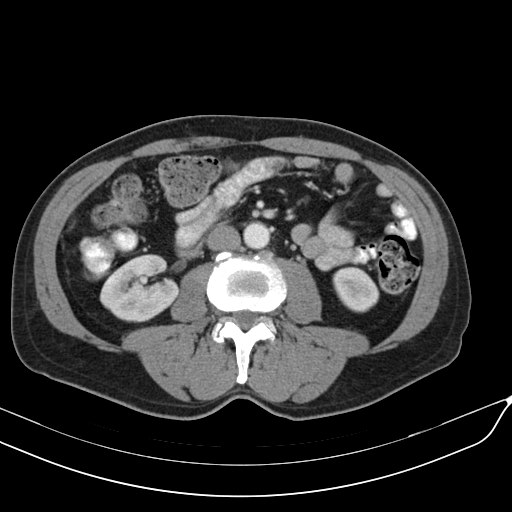
[im 57/94  bone]
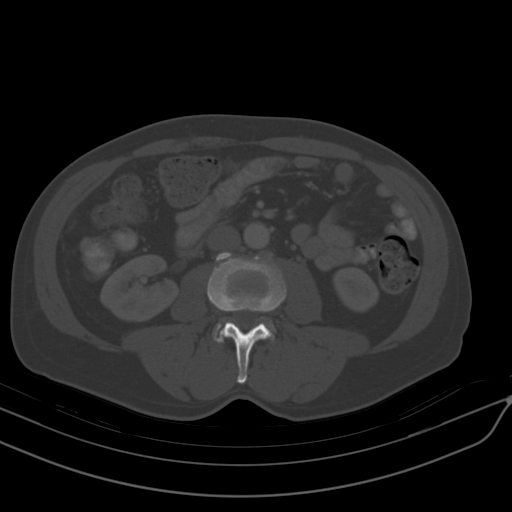
[im 63/94  soft-tissue]
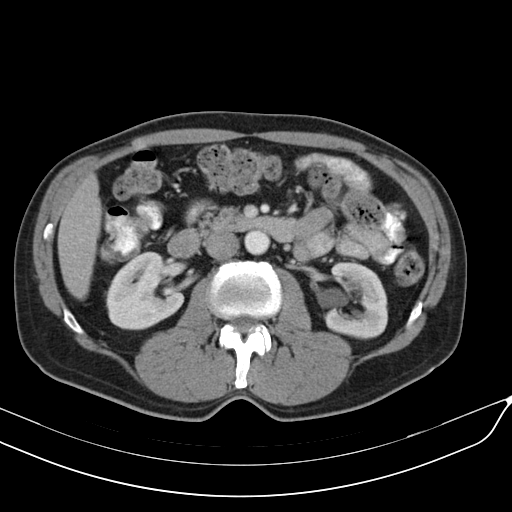
[im 68/94  soft-tissue]
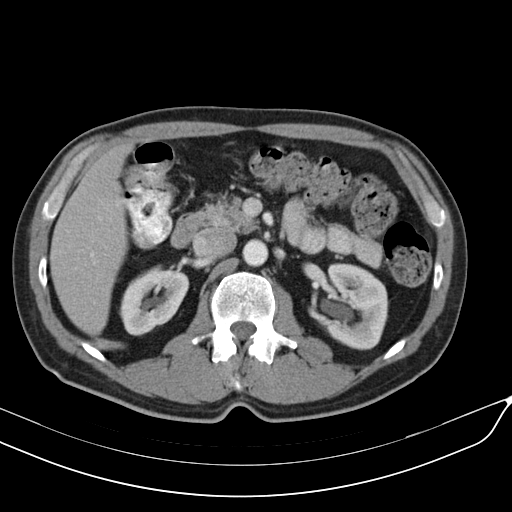
[im 73/94  soft-tissue]
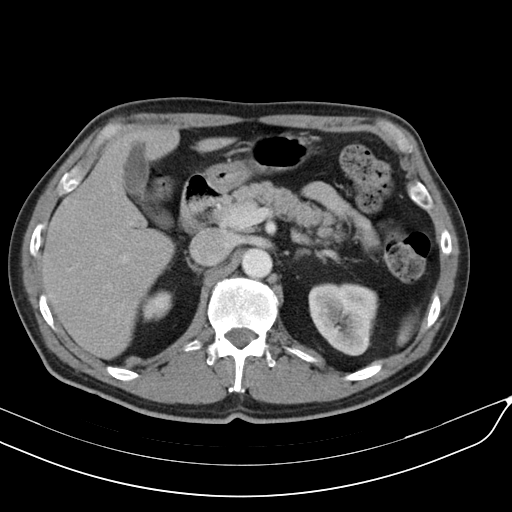
[im 83/94  soft-tissue]
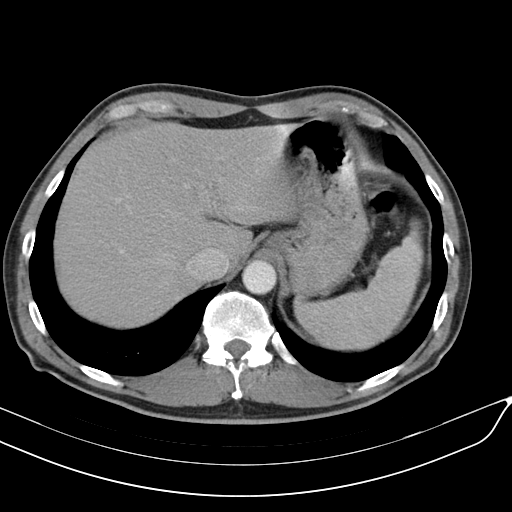
[im 88/94  soft-tissue]
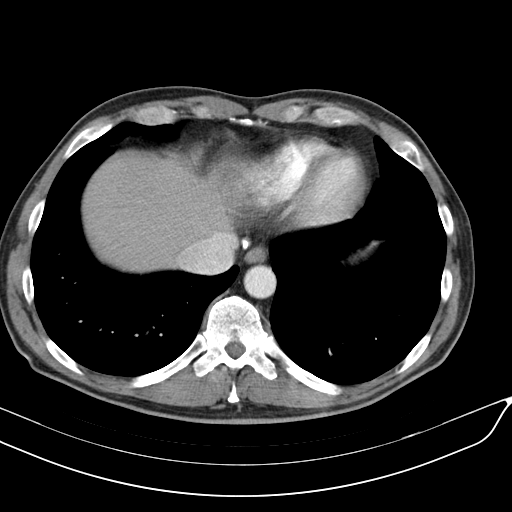

[Series 602: coronal · coronal · 0.91mm/px · 3 of 124 slices shown]
[im 42/124  soft-tissue]
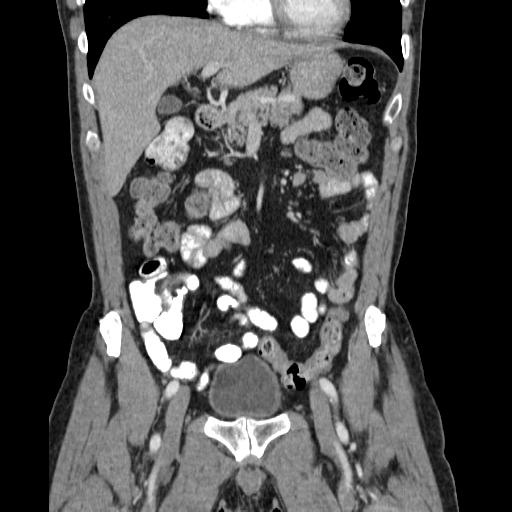
[im 55/124  soft-tissue]
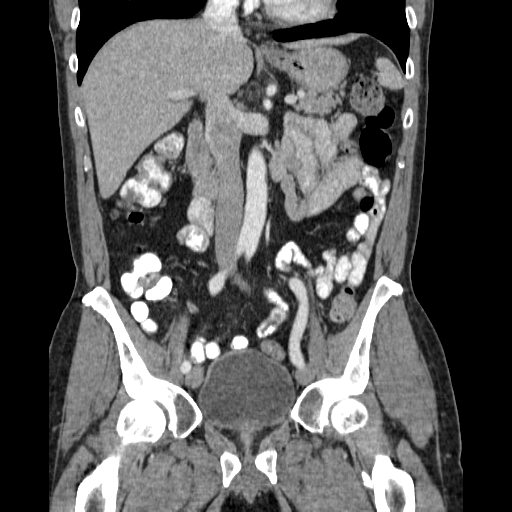
[im 69/124  soft-tissue]
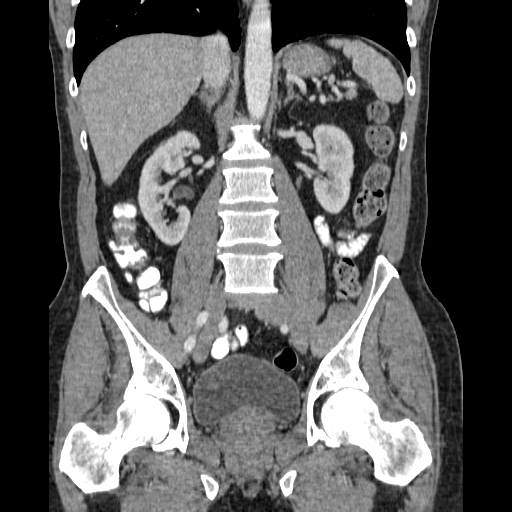

[17 of 46 positions shown; findings below may reference images not displayed]

FINDINGS: 4.4 mm subpleural nodule is noted anteriorly in the inferior portion
of the lingular segment of the left lower lobe. No significant
osseous abnormality is noted.

No gallstones are noted. The liver, spleen and pancreas appear
normal. Adrenal glands appear normal. No hydronephrosis or renal
obstruction is noted. No renal or ureteral calculi are noted.
Parapelvic cysts are noted in the left kidney. There is no evidence
of bowel obstruction. The appendix appears normal. Urinary bladder
appears normal. Mild prostatic enlargement is noted. There is no
evidence of abdominal aortic aneurysm or dissection. No abnormal
fluid collection is noted. No significant adenopathy is noted.
IMPRESSION: Mild prostatic enlargement.

No acute abnormality seen in the abdomen or pelvis.

4.4 mm subpleural nodule seen in left lung base. If the patient is
at high risk for bronchogenic carcinoma, follow-up chest CT at 1
year is recommended. If the patient is at low risk, no follow-up is
needed. This recommendation follows the consensus statement:
Guidelines for Management of Small Pulmonary Nodules Detected on CT
Scans: A Statement from the [HOSPITAL] as published in

## 2020-05-28 ENCOUNTER — Other Ambulatory Visit: Payer: Self-pay

## 2021-11-09 ENCOUNTER — Other Ambulatory Visit: Payer: Self-pay | Admitting: General Surgery

## 2021-12-08 ENCOUNTER — Encounter: Payer: Self-pay | Admitting: General Surgery

## 2021-12-09 ENCOUNTER — Ambulatory Visit
Admission: RE | Admit: 2021-12-09 | Discharge: 2021-12-09 | Disposition: A | Discharge status: Home / Self Care | Payer: Medicare Other | Source: Ambulatory Visit | Attending: General Surgery | Admitting: General Surgery

## 2021-12-09 ENCOUNTER — Ambulatory Visit: Payer: Medicare Other | Admitting: Anesthesiology

## 2021-12-09 ENCOUNTER — Encounter
Admission: RE | Disposition: A | Discharge status: Home / Self Care | Payer: Self-pay | Source: Ambulatory Visit | Attending: General Surgery

## 2021-12-09 DIAGNOSIS — Z86010 Personal history of colonic polyps: Secondary | ICD-10-CM | POA: Diagnosis not present

## 2021-12-09 DIAGNOSIS — Z8719 Personal history of other diseases of the digestive system: Secondary | ICD-10-CM | POA: Insufficient documentation

## 2021-12-09 DIAGNOSIS — Z8619 Personal history of other infectious and parasitic diseases: Secondary | ICD-10-CM | POA: Insufficient documentation

## 2021-12-09 DIAGNOSIS — K589 Irritable bowel syndrome without diarrhea: Secondary | ICD-10-CM | POA: Insufficient documentation

## 2021-12-09 DIAGNOSIS — Z1211 Encounter for screening for malignant neoplasm of colon: Secondary | ICD-10-CM | POA: Diagnosis present

## 2021-12-09 DIAGNOSIS — K5730 Diverticulosis of large intestine without perforation or abscess without bleeding: Secondary | ICD-10-CM | POA: Insufficient documentation

## 2021-12-09 DIAGNOSIS — E7800 Pure hypercholesterolemia, unspecified: Secondary | ICD-10-CM | POA: Diagnosis not present

## 2021-12-09 HISTORY — DX: Other intervertebral disc degeneration, lumbosacral region without mention of lumbar back pain or lower extremity pain: M51.379

## 2021-12-09 HISTORY — DX: Personal history of other infectious and parasitic diseases: Z86.19

## 2021-12-09 HISTORY — DX: Other specified enthesopathies of unspecified lower limb, excluding foot: M76.899

## 2021-12-09 HISTORY — DX: Unspecified hemorrhoids: K64.9

## 2021-12-09 HISTORY — DX: Other intervertebral disc degeneration, lumbosacral region: M51.37

## 2021-12-09 HISTORY — DX: Other allergy status, other than to drugs and biological substances: Z91.09

## 2021-12-09 HISTORY — DX: Irritable bowel syndrome, unspecified: K58.9

## 2021-12-09 HISTORY — DX: Pure hypercholesterolemia, unspecified: E78.00

## 2021-12-09 HISTORY — PX: COLONOSCOPY WITH PROPOFOL: SHX5780

## 2021-12-09 SURGERY — COLONOSCOPY WITH PROPOFOL
Anesthesia: General

## 2021-12-09 MED ORDER — PROPOFOL 1000 MG/100ML IV EMUL
INTRAVENOUS | Status: AC
  Filled 2021-12-09: qty 100

## 2021-12-09 MED ORDER — LIDOCAINE HCL (CARDIAC) PF 100 MG/5ML IV SOSY
PREFILLED_SYRINGE | INTRAVENOUS | Status: DC | PRN
  Administered 2021-12-09: 80 mg via INTRAVENOUS

## 2021-12-09 MED ORDER — SODIUM CHLORIDE 0.9 % IV SOLN: INTRAVENOUS | Status: DC

## 2021-12-09 MED ORDER — PROPOFOL 10 MG/ML IV BOLUS
INTRAVENOUS | Status: DC | PRN
  Administered 2021-12-09: 50 mg via INTRAVENOUS

## 2021-12-09 MED ORDER — PROPOFOL 500 MG/50ML IV EMUL
INTRAVENOUS | Status: DC | PRN
  Administered 2021-12-09: 125 ug/kg/min via INTRAVENOUS

## 2021-12-09 MED ORDER — MIDAZOLAM HCL 2 MG/2ML IJ SOLN
INTRAMUSCULAR | Status: DC | PRN
  Administered 2021-12-09: 2 mg via INTRAVENOUS

## 2021-12-09 MED ORDER — MIDAZOLAM HCL 2 MG/2ML IJ SOLN
INTRAMUSCULAR | Status: AC
  Filled 2021-12-09: qty 2

## 2021-12-09 MED ORDER — PHENYLEPHRINE HCL (PRESSORS) 10 MG/ML IV SOLN
INTRAVENOUS | Status: DC | PRN
  Administered 2021-12-09: 80 ug via INTRAVENOUS

## 2021-12-09 MED ORDER — EPHEDRINE SULFATE (PRESSORS) 50 MG/ML IJ SOLN
INTRAMUSCULAR | Status: DC | PRN
  Administered 2021-12-09: 10 mg via INTRAVENOUS
  Administered 2021-12-09: 5 mg via INTRAVENOUS

## 2021-12-09 NOTE — Anesthesia Preprocedure Evaluation (Signed)
Anesthesia Evaluation  Patient identified by MRN, date of birth, ID band Patient awake    Reviewed: Allergy & Precautions, NPO status , Patient's Chart, lab work & pertinent test results  Airway Mallampati: II  TM Distance: >3 FB Neck ROM: Full    Dental  (+) Teeth Intact   Pulmonary neg pulmonary ROS,    Pulmonary exam normal breath sounds clear to auscultation       Cardiovascular Exercise Tolerance: Good negative cardio ROS Normal cardiovascular exam Rhythm:Regular Rate:Normal     Neuro/Psych negative neurological ROS  negative psych ROS   GI/Hepatic negative GI ROS, Neg liver ROS,   Endo/Other  negative endocrine ROS  Renal/GU negative Renal ROS  negative genitourinary   Musculoskeletal   Abdominal (+) + scaphoid   Peds negative pediatric ROS (+)  Hematology negative hematology ROS (+)   Anesthesia Other Findings Past Medical History: No date: DDD (degenerative disc disease), lumbosacral No date: Diverticulitis No date: Environmental allergies No date: Hemorrhoids No date: History of chicken pox No date: Hypercholesteremia No date: IBS (irritable bowel syndrome) No date: Quadriceps tendonitis  Past Surgical History: No date: COLONOSCOPY No date: ESOPHAGOGASTRODUODENOSCOPY No date: HEMORRHOID SURGERY  BMI    Body Mass Index: 22.47 kg/m      Reproductive/Obstetrics negative OB ROS                             Anesthesia Physical Anesthesia Plan  ASA: 2  Anesthesia Plan: General   Post-op Pain Management:    Induction: Intravenous  PONV Risk Score and Plan: Propofol infusion and TIVA  Airway Management Planned: Natural Airway  Additional Equipment:   Intra-op Plan:   Post-operative Plan:   Informed Consent: I have reviewed the patients History and Physical, chart, labs and discussed the procedure including the risks, benefits and alternatives for the  proposed anesthesia with the patient or authorized representative who has indicated his/her understanding and acceptance.     Dental Advisory Given  Plan Discussed with: CRNA and Surgeon  Anesthesia Plan Comments:         Anesthesia Quick Evaluation

## 2021-12-09 NOTE — Anesthesia Postprocedure Evaluation (Signed)
Anesthesia Post Note  Patient: Juan Lucero  Procedure(s) Performed: COLONOSCOPY WITH PROPOFOL  Patient location during evaluation: PACU Anesthesia Type: General Level of consciousness: awake and awake and alert Pain management: satisfactory to patient Vital Signs Assessment: post-procedure vital signs reviewed and stable Respiratory status: spontaneous breathing and nonlabored ventilation Cardiovascular status: stable Anesthetic complications: no   No notable events documented.   Last Vitals:  Vitals:   12/09/21 0820 12/09/21 0830  BP: (!) 91/55 (!) 97/58  Pulse: 67 (!) 59  Resp: 13 15  Temp:    SpO2: 100% 100%    Last Pain:  Vitals:   12/09/21 0830  TempSrc:   PainSc: 0-No pain                 VAN STAVEREN,Pedro [place]

## 2021-12-09 NOTE — Op Note (Signed)
[facility] Patient Name: Juan Lucero Procedure Date: 12/09/2021 7:08 AM MRN: [id number] Account #: [id number] Date of Birth: [birth year] Admit Type: Outpatient Age: 71 Room: [facility] ENDO ROOM 1 Gender: Male Note Status: Finalized Instrument Name: Peds Colonoscope [id number] Procedure:             Colonoscopy Indications:           High risk colon cancer surveillance: Personal history                         of colonic polyps Providers:             Earline Mayotte, MD Referring MD:          Jillene Bucks. Arlana Pouch, MD (Referring MD) Medicines:             Propofol per Anesthesia Complications:         No immediate complications. Procedure:             Pre-Anesthesia Assessment:                        - Prior to the procedure, a History and Physical was                         performed, and patient medications, allergies and                         sensitivities were reviewed. The patient's tolerance                         of previous anesthesia was reviewed.                        - The risks and benefits of the procedure and the                         sedation options and risks were discussed with the                         patient. All questions were answered and informed                         consent was obtained.                        After obtaining informed consent, the colonoscope was                         passed under direct vision. Throughout the procedure,                         the patient's blood pressure, pulse, and oxygen                         saturations were monitored continuously. The                         Colonoscope was introduced through the anus and  advanced to the the cecum, identified by appendiceal                         orifice and ileocecal valve. The colonoscopy was                         performed without difficulty. The patient tolerated                         the procedure well. The  quality of the bowel                         preparation was adequate to identify polyps. Findings:      A few small-mouthed diverticula were found in the sigmoid colon.      The retroflexed view of the distal rectum and anal verge was normal and       showed no anal or rectal abnormalities. Impression:            - Diverticulosis in the sigmoid colon.                        - The distal rectum and anal verge are normal on                         retroflexion view.                        - No specimens collected. Recommendation:        - Repeat colonoscopy in 7 years for surveillance. Procedure Code(s):     --- Professional ---                        [phone], Colonoscopy, flexible; diagnostic, including                         collection of specimen(s) by brushing or washing, when                         performed (separate procedure) Diagnosis Code(s):     --- Professional ---                        Z86.010, Personal history of colonic polyps                        K57.30, Diverticulosis of large intestine without                         perforation or abscess without bleeding CPT copyright 2019 American [employer] Association. All rights reserved. The codes documented in this report are preliminary and upon coder review may  be revised to meet current compliance requirements. Earline Mayotte, MD 12/09/2021 8:08:32 AM This report has been signed electronically. Number of Addenda: 0 Note Initiated On: 12/09/2021 7:08 AM Scope Withdrawal Time: 0 hours 15 minutes 55 seconds  Total Procedure Duration: 0 hours 27 minutes 20 seconds  Estimated Blood Loss:  Estimated blood loss: none.      [facility]

## 2021-12-09 NOTE — Transfer of Care (Addendum)
Immediate Anesthesia Transfer of Care Note  Patient: Juan Lucero  Procedure(s) Performed: COLONOSCOPY WITH PROPOFOL  Patient Location: PACU and Endoscopy Unit  Anesthesia Type:MAC  Level of Consciousness: sedated  Airway & Oxygen Therapy: Patient Spontanous Breathing and Patient connected to nasal cannula oxygen  Post-op Assessment: Report given to RN and Post -op Vital signs reviewed and stable  Post vital signs: Reviewed and stable  Last Vitals:  Vitals Value Taken Time  BP    Temp    Pulse    Resp    SpO2      Last Pain:  Vitals:   12/09/21 0656  TempSrc: Temporal  PainSc: 0-No pain         Complications: No notable events documented.

## 2021-12-09 NOTE — H&P (Signed)
Juan Lucero [id number] Mar 25, 1951     HPI: 71 y/o male with past history of colon polyps. For follow up exam.    Medications Prior to Admission  Medication Sig Dispense Refill Last Dose   ascorbic acid (VITAMIN C) 1000 MG tablet Take 1,000 mg by mouth daily.   12/08/2021   cetirizine (ZYRTEC) 10 MG tablet Take 10 mg by mouth daily.   12/08/2021   Cholecalciferol 50 MCG (2000 UT) CAPS Take by mouth.      Saw Palmetto 1000 MG CAPS Take 1,000 mg by mouth daily.   12/08/2021   vitamin B-12 (CYANOCOBALAMIN) 1000 MCG tablet Take 1,000 mcg by mouth daily.   12/08/2021   Zinc Citrate-Phytase (ZYTAZE) 25-500 MG CAPS Take by mouth daily.   12/08/2021   aspirin EC 81 MG tablet Take 81 mg by mouth daily. Swallow whole. (Patient not taking: Reported on 12/09/2021)   Not Taking   Allergies  Allergen Reactions   Sulfamethoxazole-Trimethoprim Palpitations   Past Medical History:  Diagnosis Date   DDD (degenerative disc disease), lumbosacral    Diverticulitis    Environmental allergies    Hemorrhoids    History of chicken pox    Hypercholesteremia    IBS (irritable bowel syndrome)    Quadriceps tendonitis    Past Surgical History:  Procedure Laterality Date   COLONOSCOPY     ESOPHAGOGASTRODUODENOSCOPY     HEMORRHOID SURGERY     Social History   Socioeconomic History   Marital status: Single    Spouse name: Not on file   Number of children: Not on file   Years of education: Not on file   Highest education level: Not on file  Occupational History   Not on file  Tobacco Use   Smoking status: Never   Smokeless tobacco: Never  Vaping Use   Vaping Use: Never used  Substance and Sexual Activity   Alcohol use: No   Drug use: Never   Sexual activity: Not on file  Other Topics Concern   Not on file  Social History Narrative   Not on file   Social Determinants of Health   Financial Resource Strain: Not on file  Food Insecurity: Not on file  Transportation Needs: Not on file   Physical Activity: Not on file  Stress: Not on file  Social Connections: Not on file  Intimate Partner Violence: Not on file   Social History   Social History Narrative   Not on file     ROS: Negative.     PE: HEENT: Negative. Lungs: Clear. Cardio: RR. Assessment/Plan:  Proceed with planned endoscopy.   Merrily [facility] 12/09/2021

## 2021-12-10 ENCOUNTER — Encounter: Payer: Self-pay | Admitting: General Surgery

## 2024-02-15 ENCOUNTER — Ambulatory Visit (INDEPENDENT_AMBULATORY_CARE_PROVIDER_SITE_OTHER)

## 2024-02-15 ENCOUNTER — Ambulatory Visit
Admission: EM | Admit: 2024-02-15 | Discharge: 2024-02-15 | Disposition: A | Discharge status: Home / Self Care | Attending: Physician Assistant | Admitting: Physician Assistant

## 2024-02-15 DIAGNOSIS — J209 Acute bronchitis, unspecified: Secondary | ICD-10-CM

## 2024-02-15 DIAGNOSIS — R5383 Other fatigue: Secondary | ICD-10-CM

## 2024-02-15 DIAGNOSIS — R052 Subacute cough: Secondary | ICD-10-CM

## 2024-02-15 DIAGNOSIS — R062 Wheezing: Secondary | ICD-10-CM

## 2024-02-15 MED ORDER — CEFDINIR 300 MG PO CAPS: 300.0000 mg | ORAL_CAPSULE | Freq: Two times a day (BID) | ORAL | 0 refills | Status: AC

## 2024-02-15 MED ORDER — ALBUTEROL SULFATE HFA 108 (90 BASE) MCG/ACT IN AERS: 1.0000 | INHALATION_SPRAY | Freq: Four times a day (QID) | RESPIRATORY_TRACT | 0 refills | Status: DC | PRN

## 2024-02-15 MED ORDER — PROMETHAZINE-DM 6.25-15 MG/5ML PO SYRP: 5.0000 mL | ORAL_SOLUTION | Freq: Four times a day (QID) | ORAL | 0 refills | Status: DC | PRN

## 2024-02-15 MED ORDER — PREDNISONE 20 MG PO TABS: 40.0000 mg | ORAL_TABLET | Freq: Every day | ORAL | 0 refills | Status: AC

## 2024-02-15 MED ORDER — ALBUTEROL SULFATE (2.5 MG/3ML) 0.083% IN NEBU
2.5000 mg | INHALATION_SOLUTION | Freq: Once | RESPIRATORY_TRACT | Status: AC
  Administered 2024-02-15: 2.5 mg via RESPIRATORY_TRACT

## 2024-02-15 NOTE — Discharge Instructions (Addendum)
-  Chest xray is negative for pneumonia -You have a chest cold/bronchitis.  -Start antibiotics and short course of prednisone. Increase rest and fluids. Take cough meds as needed -Return if fever or shortness of  breath

## 2024-02-15 NOTE — ED Provider Notes (Signed)
 [facility]    CSN: [id number] Arrival date & time: 02/15/24  0946      History   Chief Complaint Chief Complaint  Patient presents with   Cough    HPI Juan Lucero is a 73 y.o. male with history of hyperlipidemia presents for 3 week history of cough, congestion, and wheezing. He denies fever, sinus pain, wore throat, ear pain, chest pain, and shortness of  breath. He says symptoms improve at times and then worsen. No history of cardiopulmonary disease and patient is a non smoker. Has been taking OTC meds without relief.   HPI  Past Medical History:  Diagnosis Date   DDD (degenerative disc disease), lumbosacral    Diverticulitis    Environmental allergies    Hemorrhoids    History of chicken pox    Hypercholesteremia    IBS (irritable bowel syndrome)    Quadriceps tendonitis     Patient Active Problem List   Diagnosis Date Noted   DDD (degenerative disc disease), lumbar 06/26/2014   Lumbar radiculitis 06/26/2014   Quadriceps tendonitis 11/13/2013   Change in bowel habits 02/08/2012   IBS (irritable bowel syndrome) 02/08/2012    Past Surgical History:  Procedure Laterality Date   COLONOSCOPY     COLONOSCOPY WITH PROPOFOL  N/A 12/09/2021   Procedure: COLONOSCOPY WITH PROPOFOL ;  Surgeon: Dessa Reyes ORN, MD;  Location: [facility] ENDOSCOPY;  Service: Endoscopy;  Laterality: N/A;   ESOPHAGOGASTRODUODENOSCOPY     HEMORRHOID SURGERY         Home Medications    Prior to Admission medications   Medication Sig Start Date End Date Taking? Authorizing Provider  albuterol (VENTOLIN HFA) 108 (90 Base) MCG/ACT inhaler Inhale 1-2 puffs into the lungs every 6 (six) hours as needed for wheezing or shortness of breath. 02/15/24  Yes Arvis Huxley B, PA-C  cefdinir (OMNICEF) 300 MG capsule Take 1 capsule (300 mg total) by mouth 2 (two) times daily for 7 days. 02/15/24 02/22/24 Yes Arvis Huxley NOVAK, PA-C  predniSONE (DELTASONE) 20 MG tablet Take 2 tablets (40 mg  total) by mouth daily for 5 days. 02/15/24 02/20/24 Yes Arvis Huxley NOVAK, PA-C  promethazine-dextromethorphan (PROMETHAZINE-DM) 6.25-15 MG/5ML syrup Take 5 mLs by mouth 4 (four) times daily as needed. 02/15/24  Yes Arvis Huxley NOVAK, PA-C  ascorbic acid (VITAMIN C) 1000 MG tablet Take 1,000 mg by mouth daily.    [provider]  aspirin EC 81 MG tablet Take 81 mg by mouth daily. Swallow whole. Patient not taking: Reported on 12/09/2021    [provider]  cetirizine (ZYRTEC) 10 MG tablet Take 10 mg by mouth daily.    [provider]  Cholecalciferol 50 MCG (2000 UT) CAPS Take by mouth.    [provider]  Saw Palmetto 1000 MG CAPS Take 1,000 mg by mouth daily.    [provider]  vitamin B-12 (CYANOCOBALAMIN) 1000 MCG tablet Take 1,000 mcg by mouth daily.    [provider]  Zinc Citrate-Phytase (ZYTAZE) 25-500 MG CAPS Take by mouth daily.    [provider]    Family History History reviewed. No pertinent family history.  Social History Social History   Tobacco Use   Smoking status: Never   Smokeless tobacco: Never  Vaping Use   Vaping status: Never Used  Substance Use Topics   Alcohol use: No   Drug use: Never     Allergies   Sulfamethoxazole -trimethoprim    Review of Systems Review of Systems  Constitutional:  Positive for fatigue. Negative for fever.  HENT:  Positive for congestion. Negative for rhinorrhea, sinus pressure, sinus pain and sore throat.   Respiratory:  Positive for cough and wheezing. Negative for shortness of breath.   Cardiovascular:  Negative for chest pain.  Gastrointestinal:  Negative for abdominal pain, diarrhea, nausea and vomiting.  Musculoskeletal:  Negative for myalgias.  Neurological:  Negative for weakness, light-headedness and headaches.  Hematological:  Negative for adenopathy.     Physical Exam Triage Vital Signs ED Triage Vitals  Encounter Vitals Group     BP      Girls Systolic  BP Percentile      Girls Diastolic BP Percentile      Boys Systolic BP Percentile      Boys Diastolic BP Percentile      Pulse      Resp      Temp      Temp src      SpO2      Weight      Height      Head Circumference      Peak Flow      Pain Score      Pain Loc      Pain Education      Exclude from Growth Chart    No data found.  Updated Vital Signs BP 122/72 (BP Location: Right Arm)   Pulse 60   Temp 98.4 F (36.9 C) (Oral)   Resp 18   Wt 182 lb 11.2 oz (82.9 kg)   SpO2 97%   BMI 23.46 kg/m       Physical Exam Vitals and nursing note reviewed.  Constitutional:      General: He is not in acute distress.    Appearance: Normal appearance. He is well-developed. He is not ill-appearing.  HENT:     Head: Normocephalic and atraumatic.     Nose: No congestion.     Mouth/Throat:     Mouth: Mucous membranes are moist.     Pharynx: Oropharynx is clear.  Eyes:     General: No scleral icterus.    Conjunctiva/sclera: Conjunctivae normal.  Cardiovascular:     Rate and Rhythm: Normal rate and regular rhythm.  Pulmonary:     Effort: Pulmonary effort is normal. No respiratory distress.     Breath sounds: Wheezing present.  Musculoskeletal:     Cervical back: Neck supple.  Skin:    General: Skin is warm and dry.     Capillary Refill: Capillary refill takes less than 2 seconds.  Neurological:     General: No focal deficit present.     Mental Status: He is alert. Mental status is at baseline.     Motor: No weakness.     Gait: Gait normal.  Psychiatric:        Mood and Affect: Mood normal.        Behavior: Behavior normal.      UC Treatments / Results  Labs (all labs ordered are listed, but only abnormal results are displayed) Labs Reviewed - No data to display  EKG   Radiology DG Chest 2 View Result Date: 02/15/2024 CLINICAL DATA:  Cough and wheezing for 3 weeks. EXAM: CHEST - 2 VIEW COMPARISON:  June 09, 2005. FINDINGS: The heart size and mediastinal  contours are within normal limits. Both lungs are clear. The visualized skeletal structures are unremarkable. IMPRESSION: No active cardiopulmonary disease. Electronically Signed   By: Lynwood Landy Raddle M.D.   On: 02/15/2024  10:56    Procedures Procedures (including critical care time)  Medications Ordered in UC Medications  albuterol (PROVENTIL) (2.5 MG/3ML) 0.083% nebulizer solution 2.5 mg (2.5 mg Nebulization Given 02/15/24 1116)    Initial Impression / Assessment and Plan / UC Course  I have reviewed the triage vital signs and the nursing notes.  Pertinent labs & imaging results that were available during my care of the patient were reviewed by me and considered in my medical decision making (see chart for details).   73 y/o male presents for cough, congestion and wheezing x 3 weeks. No fever, chest pain or SOB.  Vitals stable. O2 saturation is 92%. He is in NAD and is speaking in full and complete sentences. On exam, he has normal HEENT exam. Few scattered wheezes. Heart RRR.   Chest x-ray ordered.  Patient given albuterol neb. O2 went up to 97%.  CXR negative. Acute bronchitis. Sent cefdinir, prednisone, promethazine DM. Reviewed returning if fever, chest pain or shortness of breath.    Final Clinical Impressions(s) / UC Diagnoses   Final diagnoses:  Subacute cough  Acute bronchitis, unspecified organism  Wheezing  Other fatigue     Discharge Instructions      -Chest xray is negative for pneumonia -You have a chest cold/bronchitis.  -Start antibiotics and short course of prednisone. Increase rest and fluids. Take cough meds as needed -Return if fever or shortness of  breath     ED Prescriptions     Medication Sig Dispense Auth. Provider   cefdinir (OMNICEF) 300 MG capsule Take 1 capsule (300 mg total) by mouth 2 (two) times daily for 7 days. 14 capsule Arvis Huxley B, PA-C   predniSONE (DELTASONE) 20 MG tablet Take 2 tablets (40 mg total) by mouth daily for 5  days. 10 tablet Arvis Huxley NOVAK, PA-C   promethazine-dextromethorphan (PROMETHAZINE-DM) 6.25-15 MG/5ML syrup Take 5 mLs by mouth 4 (four) times daily as needed. 118 mL Arvis Huxley B, PA-C   albuterol (VENTOLIN HFA) 108 (90 Base) MCG/ACT inhaler Inhale 1-2 puffs into the lungs every 6 (six) hours as needed for wheezing or shortness of breath. 1 each Arvis Huxley NOVAK DEVONNA      PDMP not reviewed this encounter.   Arvis Huxley NOVAK, PA-C 02/15/24 1140

## 2024-02-15 NOTE — ED Triage Notes (Signed)
 Pt c/o cough,wheezing & chest congestion x3 wks. Denies any SOB or hx of asthma. Has tried OTC meds w/o relief.

## 2024-03-14 ENCOUNTER — Ambulatory Visit: Payer: Self-pay | Admitting: Family Medicine

## 2024-03-14 ENCOUNTER — Ambulatory Visit
Admission: EM | Admit: 2024-03-14 | Discharge: 2024-03-14 | Disposition: A | Discharge status: Home / Self Care | Attending: Family Medicine | Admitting: Family Medicine

## 2024-03-14 ENCOUNTER — Ambulatory Visit (INDEPENDENT_AMBULATORY_CARE_PROVIDER_SITE_OTHER)

## 2024-03-14 DIAGNOSIS — J22 Unspecified acute lower respiratory infection: Secondary | ICD-10-CM | POA: Insufficient documentation

## 2024-03-14 DIAGNOSIS — J069 Acute upper respiratory infection, unspecified: Secondary | ICD-10-CM | POA: Insufficient documentation

## 2024-03-14 LAB — RESP PANEL BY RT-PCR (RSV, FLU A&B, COVID)  RVPGX2
Influenza A by PCR: NEGATIVE
Influenza B by PCR: NEGATIVE
Resp Syncytial Virus by PCR: NEGATIVE
SARS Coronavirus 2 by RT PCR: NEGATIVE

## 2024-03-14 MED ORDER — ALBUTEROL SULFATE HFA 108 (90 BASE) MCG/ACT IN AERS: 2.0000 | INHALATION_SPRAY | Freq: Four times a day (QID) | RESPIRATORY_TRACT | 0 refills | Status: AC | PRN

## 2024-03-14 MED ORDER — PREDNISONE 10 MG (21) PO TBPK: ORAL_TABLET | Freq: Every day | ORAL | 0 refills | Status: AC

## 2024-03-14 MED ORDER — CEFDINIR 300 MG PO CAPS: 300.0000 mg | ORAL_CAPSULE | Freq: Two times a day (BID) | ORAL | 0 refills | Status: AC

## 2024-03-14 NOTE — ED Triage Notes (Signed)
 Patient states that hess till having sx from his last visit on 10-1. Patient states that he's now having sinus pressure that started 3 days ago.   Productive cough Sinus pressure

## 2024-03-14 NOTE — Discharge Instructions (Addendum)
 Your chest xray did not show evidence of pneumonia  though the radiologist has not yet read it. If they find something that I didn't, I will call you.    Stop by the pharmacy to pick up your prescriptions.  Follow up with your primary care provider or return to the urgent care, if not improving.

## 2024-03-14 NOTE — ED Provider Notes (Signed)
 [facility]    CSN: [id number] Arrival date & time: 03/14/24  1308      History   Chief Complaint Chief Complaint  Patient presents with   Cough    HPI Juan Lucero is a 73 y.o. male.   HPI  History obtained from the patient. Juan Lucero presents for cough and chest congestion for the past 5-6 weeks. The cough has gotten worse.  A few days ago started having rhinorrhea and nasal congestion.  Denies shortness of breath and chest pain or tightness. Taking OTC cold and flu medications. His girlfriend has similar sx.    No history of asthma. Denies vaping and smoking.        Past Medical History:  Diagnosis Date   DDD (degenerative disc disease), lumbosacral    Diverticulitis    Environmental allergies    Hemorrhoids    History of chicken pox    Hypercholesteremia    IBS (irritable bowel syndrome)    Quadriceps tendonitis     Patient Active Problem List   Diagnosis Date Noted   DDD (degenerative disc disease), lumbar 06/26/2014   Lumbar radiculitis 06/26/2014   Quadriceps tendonitis 11/13/2013   Change in bowel habits 02/08/2012   IBS (irritable bowel syndrome) 02/08/2012    Past Surgical History:  Procedure Laterality Date   COLONOSCOPY     COLONOSCOPY WITH PROPOFOL  N/A 12/09/2021   Procedure: COLONOSCOPY WITH PROPOFOL ;  Surgeon: Dessa Reyes ORN, MD;  Location: [facility] END[facility]COPY;  Service: Endoscopy;  Laterality: N/A;   ESOPHAGOGASTRODUODENOSCOPY     HEMORRHOID SURGERY         Home Medications    Prior to Admission medications   Medication Sig Start Date End Date Taking? Authorizing Provider  cefdinir (OMNICEF) 300 MG capsule Take 1 capsule (300 mg total) by mouth 2 (two) times daily. 03/14/24  Yes Jerimy Johanson, DO  predniSONE (STERAPRED UNI-PAK 21 TAB) 10 MG (21) TBPK tablet Take by mouth daily. Take 6 tabs by mouth daily for 1, then 5 tabs for 1 day, then 4 tabs for 1 day, then 3 tabs for 1 day, then 2 tabs for 1 day, then 1 tab  for 1 day. 03/14/24  Yes Krrish Freund, DO  albuterol (VENTOLIN HFA) 108 (90 Base) MCG/ACT inhaler Inhale 2 puffs into the lungs every 6 (six) hours as needed for wheezing or shortness of breath. 03/14/24   Liset Mcmonigle, DO  ascorbic acid (VITAMIN C) 1000 MG tablet Take 1,000 mg by mouth daily.    [provider]  aspirin EC 81 MG tablet Take 81 mg by mouth daily. Swallow whole. Patient not taking: Reported on 12/09/2021    [provider]  cetirizine (ZYRTEC) 10 MG tablet Take 10 mg by mouth daily.    [provider]  Cholecalciferol 50 MCG (2000 UT) CAPS Take by mouth.    [provider]  Saw Palmetto 1000 MG CAPS Take 1,000 mg by mouth daily.    [provider]  vitamin B-12 (CYANOCOBALAMIN) 1000 MCG tablet Take 1,000 mcg by mouth daily.    [provider]  Zinc Citrate-Phytase (ZYTAZE) 25-500 MG CAPS Take by mouth daily.    [provider]    Family History History reviewed. No pertinent family history.  Social History Social History   Tobacco Use   Smoking status: Never   Smokeless tobacco: Never  Vaping Use   Vaping status: Never Used  Substance Use Topics   Alcohol use: No   Drug use:  Never     Allergies   Sulfamethoxazole -trimethoprim    Review of Systems Review of Systems: negative unless otherwise stated in HPI.      Physical Exam Triage Vital Signs ED Triage Vitals  Encounter Vitals Group     BP 03/14/24 1325 109/71     Girls Systolic BP Percentile --      Girls Diastolic BP Percentile --      Boys Systolic BP Percentile --      Boys Diastolic BP Percentile --      Pulse Rate 03/14/24 1325 75     Resp 03/14/24 1325 19     Temp 03/14/24 1325 98.6 F (37 C)     Temp Source 03/14/24 1325 Oral     SpO2 03/14/24 1325 (!) 89 %     Weight 03/14/24 1324 175 lb (79.4 kg)     Height --      Head Circumference --      Peak Flow --      Pain Score 03/14/24 1324 0     Pain Loc --      Pain  Education --      Exclude from Growth Chart --    No data found.  Updated Vital Signs BP 109/71 (BP Location: Right Arm)   Pulse 75   Temp 98.6 F (37 C) (Oral)   Resp 19   Wt 79.4 kg   SpO2 94%   BMI 22.47 kg/m   Visual Acuity Right Eye Distance:   Left Eye Distance:   Bilateral Distance:    Right Eye Near:   Left Eye Near:    Bilateral Near:     Physical Exam GEN:     alert, non-toxic appearing male in no distress    HENT:  mucus membranes moist, oropharyngeal without lesions or erythema, clear nasal discharge EYES:   pupils equal and reactive, no scleral injection or discharge NECK:  normal ROM, no meningismus   RESP:  no increased work of breathing, rhonchi bilaterally, no wheezing or rales, good air movement  CVS:   regular rate and rhythm Skin:   warm and dry, no rash on visible skin    UC Treatments / Results  Labs (all labs ordered are listed, but only abnormal results are displayed) Labs Reviewed  RESP PANEL BY RT-PCR (RSV, FLU A&B, COVID)  [id number]    EKG   Radiology DG Chest 2 View Result Date: 03/14/2024 CLINICAL DATA:  Productive cough 1 month. EXAM: CHEST - 2 VIEW COMPARISON:  02/15/2024 FINDINGS: Lungs are adequately inflated without focal airspace consolidation or effusion. Cardiomediastinal silhouette and remainder of the exam is unchanged. IMPRESSION: No active cardiopulmonary disease. Electronically Signed   By: Toribio Agreste M.D.   On: 03/14/2024 14:41     Procedures Procedures (including critical care time)  Medications Ordered in UC Medications - No data to display  Initial Impression / Assessment and Plan / UC Course  I have reviewed the triage vital signs and the nursing notes.  Pertinent labs & imaging results that were available during my care of the patient were reviewed by me and considered in my medical decision making (see chart for details).       Pt is a 73 y.o. male who presents for 2-3 days of respiratory symptoms  with cough for the past 5-6 weeks. Juan Lucero is afebrile here without recent antipyretics. Satting 89-94% on room air. Overall pt is non-toxic appearing, well hydrated, without respiratory distress. Pulmonary exam is unremarkable.  RSV, COVID and influenza panel obtained as he has new symptoms and was negative.  History consistent with viral upper respiratory illness. Given his cough for 6 weeks recommended chest xray. Chest xray personally reviewed by me without focal pneumonia, pleural effusion, cardiomegaly or pneumothorax. Patient aware the radiologist has not read his xray and is comfortable with the preliminary read by me. Will review radiologist read when available and call patient if a change in plan is warranted.  Pt agreeable to this plan prior to discharge.     Treat lower respiratory tract infection with Cefdinir as pt states this medication doesn't cause stomach upset. Discussed symptomatic treatment. Prednisone taper and albuterol prescribed also. Continue rx cough syrup use as needed.   Typical duration of symptoms discussed.   Return and ED precautions given and voiced understanding. Discussed MDM, treatment plan and plan for follow-up with patient who agrees with plan.     Final Clinical Impressions(s) / UC Diagnoses   Final diagnoses:  Lower respiratory tract infection  Viral upper respiratory illness     Discharge Instructions      Your chest xray did not show evidence of pneumonia though the radiologist has not yet read it. If they find something that I didn't, I will call you.    Stop by the pharmacy to pick up your prescriptions.  Follow up with your primary care provider or return to the urgent care, if not improving.        ED Prescriptions     Medication Sig Dispense Auth. Provider   predniSONE (STERAPRED UNI-PAK 21 TAB) 10 MG (21) TBPK tablet Take by mouth daily. Take 6 tabs by mouth daily for 1, then 5 tabs for 1 day, then 4 tabs for 1 day, then 3 tabs for 1  day, then 2 tabs for 1 day, then 1 tab for 1 day. 21 tablet Symphany Fleissner, DO   cefdinir (OMNICEF) 300 MG capsule Take 1 capsule (300 mg total) by mouth 2 (two) times daily. 14 capsule Stacey Sago, DO   albuterol (VENTOLIN HFA) 108 (90 Base) MCG/ACT inhaler Inhale 2 puffs into the lungs every 6 (six) hours as needed for wheezing or shortness of breath. 1 each Christean Silvestri, DO      PDMP not reviewed this encounter.   Zariana Strub, DO 03/14/24 1449
